# Patient Record
Sex: Male | Born: 1985 | Race: White | Hispanic: No | Marital: Single | Smoking: Former smoker
Health system: Southern US, Community
[De-identification: ages and names within clinical notes are randomized; demographics above are authoritative.]

## PROBLEM LIST (undated history)

## (undated) DIAGNOSIS — H43393 Other vitreous opacities, bilateral: Secondary | ICD-10-CM

## (undated) DIAGNOSIS — F419 Anxiety disorder, unspecified: Secondary | ICD-10-CM

## (undated) DIAGNOSIS — R561 Post traumatic seizures: Secondary | ICD-10-CM

## (undated) DIAGNOSIS — F1010 Alcohol abuse, uncomplicated: Secondary | ICD-10-CM

## (undated) DIAGNOSIS — F4310 Post-traumatic stress disorder, unspecified: Secondary | ICD-10-CM

## (undated) DIAGNOSIS — S062X9A Diffuse traumatic brain injury with loss of consciousness of unspecified duration, initial encounter: Secondary | ICD-10-CM

## (undated) DIAGNOSIS — F32A Depression, unspecified: Secondary | ICD-10-CM

## (undated) DIAGNOSIS — F329 Major depressive disorder, single episode, unspecified: Secondary | ICD-10-CM

## (undated) HISTORY — DX: Depression, unspecified: F32.A

## (undated) HISTORY — DX: Diffuse traumatic brain injury with loss of consciousness of unspecified duration, initial encounter: S06.2X9A

## (undated) HISTORY — DX: Post traumatic seizures: R56.1

## (undated) HISTORY — PX: KNEE SURGERY: SHX244

## (undated) HISTORY — DX: Anxiety disorder, unspecified: F41.9

## (undated) HISTORY — DX: Major depressive disorder, single episode, unspecified: F32.9

## (undated) HISTORY — DX: Other vitreous opacities, bilateral: H43.393

## (undated) HISTORY — DX: Alcohol abuse, uncomplicated: F10.10

## (undated) HISTORY — DX: Post-traumatic stress disorder, unspecified: F43.10

---
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
## 2004-03-28 ENCOUNTER — Ambulatory Visit: Payer: Self-pay | Admitting: Internal Medicine

## 2004-04-07 ENCOUNTER — Emergency Department (HOSPITAL_COMMUNITY): Admission: EM | Admit: 2004-04-07 | Discharge: 2004-04-07 | Payer: Self-pay | Admitting: Emergency Medicine

## 2004-10-09 ENCOUNTER — Emergency Department (HOSPITAL_COMMUNITY): Admission: EM | Admit: 2004-10-09 | Discharge: 2004-10-09 | Payer: Self-pay | Admitting: Emergency Medicine

## 2006-08-08 ENCOUNTER — Ambulatory Visit: Payer: Self-pay | Admitting: Internal Medicine

## 2006-11-14 ENCOUNTER — Ambulatory Visit: Payer: Self-pay | Admitting: Internal Medicine

## 2006-11-14 DIAGNOSIS — F411 Generalized anxiety disorder: Secondary | ICD-10-CM | POA: Insufficient documentation

## 2006-11-14 LAB — CONVERTED CEMR LAB

## 2006-11-19 ENCOUNTER — Telehealth: Payer: Self-pay | Admitting: *Deleted

## 2006-11-21 ENCOUNTER — Telehealth: Payer: Self-pay | Admitting: Internal Medicine

## 2007-01-20 ENCOUNTER — Telehealth (INDEPENDENT_AMBULATORY_CARE_PROVIDER_SITE_OTHER): Payer: Self-pay | Admitting: *Deleted

## 2007-01-29 ENCOUNTER — Telehealth: Payer: Self-pay | Admitting: Internal Medicine

## 2007-05-06 ENCOUNTER — Telehealth: Payer: Self-pay | Admitting: Internal Medicine

## 2007-05-14 ENCOUNTER — Telehealth (INDEPENDENT_AMBULATORY_CARE_PROVIDER_SITE_OTHER): Payer: Self-pay | Admitting: *Deleted

## 2007-05-18 ENCOUNTER — Ambulatory Visit: Payer: Self-pay | Admitting: Family Medicine

## 2007-05-18 DIAGNOSIS — R61 Generalized hyperhidrosis: Secondary | ICD-10-CM | POA: Insufficient documentation

## 2007-05-19 LAB — CONVERTED CEMR LAB
Albumin: 4.5 g/dL (ref 3.5–5.2)
Alkaline Phosphatase: 60 units/L (ref 39–117)
Amylase: 56 units/L (ref 27–131)
Basophils Absolute: 0 10*3/uL (ref 0.0–0.1)
CO2: 30 meq/L (ref 19–32)
Calcium: 9.6 mg/dL (ref 8.4–10.5)
Chloride: 103 meq/L (ref 96–112)
Creatinine, Ser: 1 mg/dL (ref 0.4–1.5)
Eosinophils Relative: 2.8 % (ref 0.0–5.0)
GFR calc non Af Amer: 99 mL/min
Glucose, Bld: 68 mg/dL — ABNORMAL LOW (ref 70–99)
HCT: 48.9 % (ref 39.0–52.0)
MCHC: 35.1 g/dL (ref 30.0–36.0)
Potassium: 4.6 meq/L (ref 3.5–5.1)
TSH: 1.8 microintl units/mL (ref 0.35–5.50)
Total Bilirubin: 0.7 mg/dL (ref 0.3–1.2)

## 2007-05-29 ENCOUNTER — Telehealth: Payer: Self-pay | Admitting: Family Medicine

## 2007-07-24 ENCOUNTER — Telehealth: Payer: Self-pay | Admitting: Family Medicine

## 2007-07-24 DIAGNOSIS — L708 Other acne: Secondary | ICD-10-CM

## 2007-09-30 ENCOUNTER — Telehealth: Payer: Self-pay | Admitting: Family Medicine

## 2007-10-09 ENCOUNTER — Telehealth (INDEPENDENT_AMBULATORY_CARE_PROVIDER_SITE_OTHER): Payer: Self-pay | Admitting: *Deleted

## 2007-11-13 ENCOUNTER — Telehealth: Payer: Self-pay | Admitting: Family Medicine

## 2007-11-13 ENCOUNTER — Emergency Department (HOSPITAL_BASED_OUTPATIENT_CLINIC_OR_DEPARTMENT_OTHER): Admission: EM | Admit: 2007-11-13 | Discharge: 2007-11-13 | Payer: Self-pay | Admitting: Emergency Medicine

## 2007-11-26 ENCOUNTER — Emergency Department (HOSPITAL_BASED_OUTPATIENT_CLINIC_OR_DEPARTMENT_OTHER): Admission: EM | Admit: 2007-11-26 | Discharge: 2007-11-26 | Payer: Self-pay | Admitting: Emergency Medicine

## 2008-02-05 ENCOUNTER — Telehealth: Payer: Self-pay | Admitting: Family Medicine

## 2008-02-17 ENCOUNTER — Telehealth: Payer: Self-pay | Admitting: Family Medicine

## 2008-07-08 ENCOUNTER — Ambulatory Visit: Payer: Self-pay | Admitting: Family Medicine

## 2008-07-08 DIAGNOSIS — M545 Low back pain, unspecified: Secondary | ICD-10-CM | POA: Insufficient documentation

## 2008-07-08 DIAGNOSIS — J209 Acute bronchitis, unspecified: Secondary | ICD-10-CM

## 2008-07-11 LAB — CONVERTED CEMR LAB
ALT: 16 units/L (ref 0–53)
AST: 21 units/L (ref 0–37)
Alkaline Phosphatase: 67 units/L (ref 39–117)
Total Bilirubin: 0.8 mg/dL (ref 0.3–1.2)
Total Protein: 7.9 g/dL (ref 6.0–8.3)

## 2008-08-22 ENCOUNTER — Telehealth: Payer: Self-pay | Admitting: Family Medicine

## 2008-11-03 ENCOUNTER — Ambulatory Visit: Payer: Self-pay | Admitting: Family Medicine

## 2008-11-03 DIAGNOSIS — B351 Tinea unguium: Secondary | ICD-10-CM | POA: Insufficient documentation

## 2008-11-17 ENCOUNTER — Ambulatory Visit: Payer: Self-pay | Admitting: Family Medicine

## 2008-11-17 DIAGNOSIS — S83509A Sprain of unspecified cruciate ligament of unspecified knee, initial encounter: Secondary | ICD-10-CM

## 2008-12-20 ENCOUNTER — Ambulatory Visit: Payer: Self-pay | Admitting: Family Medicine

## 2008-12-20 DIAGNOSIS — R1031 Right lower quadrant pain: Secondary | ICD-10-CM

## 2008-12-20 LAB — CONVERTED CEMR LAB
Bilirubin Urine: NEGATIVE
Nitrite: NEGATIVE
Specific Gravity, Urine: >=1.03
WBC Urine, dipstick: NEGATIVE
pH: 6

## 2008-12-21 ENCOUNTER — Telehealth: Payer: Self-pay | Admitting: Family Medicine

## 2008-12-21 LAB — CONVERTED CEMR LAB
ALT: 20 units/L (ref 0–53)
Albumin: 4.6 g/dL (ref 3.5–5.2)
BUN: 13 mg/dL (ref 6–23)
Eosinophils Absolute: 0.3 10*3/uL (ref 0.0–0.7)
Glucose, Bld: 86 mg/dL (ref 70–99)
Hemoglobin: 16.4 g/dL (ref 13.0–17.0)
Lymphocytes Relative: 28.1 % (ref 12.0–46.0)
MCHC: 34.8 g/dL (ref 30.0–36.0)
MCV: 93.7 fL (ref 78.0–100.0)
Monocytes Absolute: 0.5 10*3/uL (ref 0.1–1.0)
Platelets: 200 10*3/uL (ref 150.0–400.0)
RBC: 5.03 M/uL (ref 4.22–5.81)
RDW: 12.9 % (ref 11.5–14.6)
Sodium: 143 meq/L (ref 135–145)
WBC: 5.9 10*3/uL (ref 4.5–10.5)

## 2008-12-28 ENCOUNTER — Telehealth: Payer: Self-pay | Admitting: Family Medicine

## 2008-12-30 ENCOUNTER — Telehealth: Payer: Self-pay | Admitting: Family Medicine

## 2009-09-11 ENCOUNTER — Telehealth: Payer: Self-pay | Admitting: Family Medicine

## 2009-09-12 ENCOUNTER — Telehealth: Payer: Self-pay | Admitting: Internal Medicine

## 2010-01-27 ENCOUNTER — Emergency Department (HOSPITAL_COMMUNITY)
Admission: EM | Admit: 2010-01-27 | Discharge: 2010-01-27 | Payer: Self-pay | Source: Home / Self Care | Admitting: Emergency Medicine

## 2010-05-29 NOTE — Progress Notes (Signed)
Summary: antibiotics.  Phone Note Call from Patient   Caller: Patient Call For: Nelwyn Salisbury MD Summary of Call: Pt is requesting antibiotics as Dr. Clent Ridges has denied the request until he has OV. Pt wants to ask Dr. Lovell Sheehan???? Explained he has not seen Dr. Leretha Pol in 2-3 years.  He states he has seen him within the year.  Initial call taken by: Lynann Beaver CMA,  Sep 12, 2009 1:31 PM  Follow-up for Phone Call        NO.  per Dr. Lovell Sheehan.  Pt was advised in previous call that Dr. Lovell Sheehan would not do this. Follow-up by: Lynann Beaver CMA,  Sep 12, 2009 1:34 PM     Appended Document: antibiotics. Called again.  Same request.  Patient advised.

## 2010-05-29 NOTE — Progress Notes (Signed)
Summary: wants Rx  Phone Note Call from Patient Call back at [phone]   Caller: Patient Call For: Nelwyn Salisbury MD Summary of Call: C/o congestion, hacking cough. No fever. Took 3 days Keflex for sore throat. Now says he wants Z-pak. [facility]. Initial call taken by: Raechel Ache, RN,  Sep 11, 2009 2:07 PM  Follow-up for Phone Call        needs an OV Follow-up by: Nelwyn Salisbury MD,  Sep 11, 2009 5:18 PM  Additional Follow-up for Phone Call Additional follow up Details #1::        Phone Call Completed Additional Follow-up by: Raechel Ache, RN,  Sep 11, 2009 5:20 PM

## 2010-08-15 ENCOUNTER — Telehealth: Payer: Self-pay | Admitting: *Deleted

## 2010-08-15 ENCOUNTER — Telehealth: Payer: Self-pay | Admitting: Family Medicine

## 2010-08-15 MED ORDER — DOXYCYCLINE HYCLATE 100 MG PO TABS: 100.0000 mg | ORAL_TABLET | Freq: Two times a day (BID) | ORAL | Status: DC

## 2010-08-15 MED ORDER — DOXYCYCLINE HYCLATE 100 MG PO TABS: 100.0000 mg | ORAL_TABLET | Freq: Two times a day (BID) | ORAL | Status: AC

## 2010-08-15 NOTE — Telephone Encounter (Signed)
Ok rx resent [address] .

## 2010-08-15 NOTE — Telephone Encounter (Signed)
Could not notify pt due to wrong numbers provided.

## 2010-08-15 NOTE — Telephone Encounter (Signed)
[facility] called from [place], [place] and said that they rcvd a ecript for this pt. Pls res[facility] to correct [facility] in [place], [place] on [address].

## 2010-08-15 NOTE — Telephone Encounter (Signed)
Pt wants prescription for Doxycycline 100 mg for Malaria prevention.  Traveling outside of the country.

## 2010-08-15 NOTE — Telephone Encounter (Signed)
Call in 100 mg bid , #60 with no rf

## 2010-08-16 ENCOUNTER — Telehealth: Payer: Self-pay | Admitting: Family Medicine

## 2010-08-16 NOTE — Telephone Encounter (Signed)
Pt called and said that he needs the Doxycycline 100 mg [facility] [place] [phone].  Pt is going out of country and needs to start taking, before he leaves.

## 2010-08-17 NOTE — Telephone Encounter (Signed)
Pt notified and apologized that he did not update his file but requesting antibiotic called to [facility] [place]. rx called in and  patient notified......Marland Kitchen

## 2010-10-24 ENCOUNTER — Telehealth: Payer: Self-pay | Admitting: *Deleted
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
## 2010-10-25 NOTE — Telephone Encounter (Signed)
Pt notified of Dr. Claris Che recommendations.

## 2010-10-25 NOTE — Telephone Encounter (Signed)
NO, he needs to get these from a local [place] doctor

## 2010-11-20 ENCOUNTER — Encounter: Payer: Self-pay | Admitting: Family Medicine

## 2010-11-20 ENCOUNTER — Ambulatory Visit (INDEPENDENT_AMBULATORY_CARE_PROVIDER_SITE_OTHER): Payer: 59 | Admitting: Family Medicine

## 2010-11-20 VITALS — BP 116/78 | HR 92 | Temp 98.4°F | Wt 153.0 lb

## 2010-11-20 DIAGNOSIS — F411 Generalized anxiety disorder: Secondary | ICD-10-CM

## 2010-11-20 DIAGNOSIS — R5383 Other fatigue: Secondary | ICD-10-CM

## 2010-11-20 DIAGNOSIS — R5381 Other malaise: Secondary | ICD-10-CM

## 2010-11-20 DIAGNOSIS — L409 Psoriasis, unspecified: Secondary | ICD-10-CM

## 2010-11-20 DIAGNOSIS — F419 Anxiety disorder, unspecified: Secondary | ICD-10-CM

## 2010-11-20 DIAGNOSIS — L408 Other psoriasis: Secondary | ICD-10-CM

## 2010-11-20 MED ORDER — CYANOCOBALAMIN 1000 MCG/ML IJ SOLN
1000.0000 ug | Freq: Once | INTRAMUSCULAR | Status: AC
  Administered 2010-11-20: 1000 ug via INTRAMUSCULAR
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
## 2010-11-21 ENCOUNTER — Telehealth: Payer: Self-pay | Admitting: Family Medicine

## 2010-11-21 LAB — VITAMIN B12: Vitamin B-12: 306 pg/mL (ref 211–911)

## 2010-11-21 LAB — CBC WITH DIFFERENTIAL/PLATELET
Basophils Relative: 0.7 % (ref 0.0–3.0)
Eosinophils Absolute: 0.2 10*3/uL (ref 0.0–0.7)
Eosinophils Relative: 2.6 % (ref 0.0–5.0)
Hemoglobin: 16.8 g/dL (ref 13.0–17.0)
Lymphs Abs: 1.6 10*3/uL (ref 0.7–4.0)
MCHC: 34.2 g/dL (ref 30.0–36.0)
Monocytes Absolute: 0.8 10*3/uL (ref 0.1–1.0)
Monocytes Relative: 11.7 % (ref 3.0–12.0)
Platelets: 194 10*3/uL (ref 150.0–400.0)
RBC: 5.28 Mil/uL (ref 4.22–5.81)

## 2010-11-21 LAB — BASIC METABOLIC PANEL
CO2: 29 mEq/L (ref 19–32)
Chloride: 105 mEq/L (ref 96–112)
Sodium: 142 mEq/L (ref 135–145)

## 2010-11-21 LAB — HEPATIC FUNCTION PANEL
Albumin: 5.2 g/dL (ref 3.5–5.2)
Alkaline Phosphatase: 54 U/L (ref 39–117)
Bilirubin, Direct: 0 mg/dL (ref 0.0–0.3)
Total Bilirubin: 0.6 mg/dL (ref 0.3–1.2)

## 2010-11-21 LAB — TSH: TSH: 1.86 u[IU]/mL (ref 0.35–5.50)

## 2010-11-21 LAB — HIV ANTIBODY (ROUTINE TESTING W REFLEX): HIV: NONREACTIVE

## 2010-11-21 NOTE — Telephone Encounter (Signed)
Pt called to req lab results. Pls cal back asap.

## 2010-11-22 ENCOUNTER — Telehealth: Payer: Self-pay | Admitting: *Deleted

## 2010-11-22 NOTE — Telephone Encounter (Signed)
Pt asking for labs, and we went over his lab values.   He wants to know what Dr. Clent Ridges thinks about his symptoms as he is still having the same problems from his last visit.

## 2010-11-22 NOTE — Telephone Encounter (Signed)
Pt aware.

## 2010-11-22 NOTE — Telephone Encounter (Signed)
My best explanation for his chronic fatigue is anxiety, since we see this all the time. Unfortunately he has not found anything that works well for him. I suggest he see a doctor in [place] about trying something else

## 2011-08-26 ENCOUNTER — Telehealth: Payer: Self-pay | Admitting: *Deleted
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
## 2011-08-27 NOTE — Telephone Encounter (Signed)
agreed

## 2011-11-18 ENCOUNTER — Telehealth: Payer: Self-pay | Admitting: Family Medicine

## 2011-11-18 NOTE — Telephone Encounter (Signed)
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  Wants to know if Dr. Clent Barrett would consider Rx for this.   Declines triage; would like to discuss with Dr. Clent Barrett and/or try some samples for this.  Info to office for provider review/callback. MAY REACH PATIENT [phone].

## 2011-11-19 NOTE — Telephone Encounter (Signed)
Pt decline ov.  °

## 2011-11-19 NOTE — Telephone Encounter (Signed)
Left voice message with below information. 

## 2011-11-19 NOTE — Telephone Encounter (Signed)
He needs an OV to discuss this  

## 2011-12-12 ENCOUNTER — Telehealth: Payer: Self-pay | Admitting: Family Medicine

## 2011-12-12 NOTE — Telephone Encounter (Signed)
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 Left knee and foot has screws and plates and balance issues. Please advise. Patient states that he does not have health insurance and can not afford to come in as his VA disability has not been sent to [place] from [place] and he has to re-register and this can take 90days.

## 2011-12-13 NOTE — Telephone Encounter (Signed)
Please advise 

## 2011-12-13 NOTE — Telephone Encounter (Signed)
Attempt to call pt - James Barrett - James Barrett if problems- gave dr. Claris Che instructions.

## 2011-12-13 NOTE — Telephone Encounter (Signed)
NO I cannot do this. He needs to use the [place] system or the orthopedist who did his surgery

## 2012-02-01 ENCOUNTER — Ambulatory Visit

## 2012-02-10 ENCOUNTER — Telehealth: Payer: Self-pay

## 2012-02-10 NOTE — Telephone Encounter (Signed)
Patient just called back. He made a mistake and was seen [facility] not here. Please disregard all messages.

## 2012-02-10 NOTE — Telephone Encounter (Signed)
There is no documentation you had office visit with this patient, there are no charges or office notes. Please advise, patient states he had visit with you, perhaps you can go back and document this under the 02/01/12 encounter? Please advise do you want to renew his prednisone and norco I am unsure what you gave and how it was dosed. Amy

## 2012-02-10 NOTE — Telephone Encounter (Signed)
The patient called to ask Dr. Milus Glazier to refill his Norco and Prednisone rxs, that were written 02/01/12, but there is no record of this appointment being completed.  In the system is shows as "no show" EOD status.  The patient stated he was seen for poison ivy and the prednisone has helped "a lot", but his VA appointment is not until 02/21/12. Please call the patient at [phone].

## 2012-02-10 NOTE — Telephone Encounter (Signed)
FYI, you did not forget this, there was not a visit, he was seen elsewhere. Amy

## 2012-04-27 ENCOUNTER — Telehealth: Payer: Self-pay | Admitting: Family Medicine

## 2012-04-27 NOTE — Telephone Encounter (Signed)
[facility] Triage Call Report Triage Record Num: [id number] Operator: Albertine Grates Patient Name: James Barrett Call Date & Time: 04/24/2012 5:18:25PM Patient Phone: [phone] PCP: Tera Mater. Clent Ridges Patient Gender: Male PCP Fax : [phone] Patient DOB: [birth year] [facility] Name: [facility] Reason for Call: Caller: James Barrett/Patient; PCP: Gershon Crane (Family Practice); CB#: [phone]; Has vomiting/diarrhea since 12-26. Has vomited x3 and had diarrhea x 10. Afebrile. Is voiding well. Declined Phenergan for nausea. Home care advice givne per Vomiting protocol. Protocol(s) Used: Nausea or Vomiting Recommended Outcome per Protocol: Provide Home/Self Care Reason for Outcome: New onset of 3-4 episodes vomiting or diarrhea following mild abdominal cramping Care Advice: ~ Call provider if symptoms do not improve after 24 hours of home care. Go to the ED if you have developed signs and symptoms of dehydration such as very dry mouth and tongue; increased pulse rate at rest; no urine output for 8 hours or more; increasing weakness or drowsiness, or lightheadedness when trying to sit upright or standing. ~ Vomiting and Diarrhea Care: - Do not eat solid foods until vomiting subsides. - Begin taking fluids by sucking on ice chips or popsicles or taking sips of cool, clear fluids (soda, fruit juices that are low acid, sports drinks or nonprescription oral rehydration solution). - Gradually drink larger amounts of these fluids so that you are drinking six to eight 8 oz. (1.2 to 1.6 liters) of fluids a day. - Keep activity to a minimum. - Once vomiting and diarrhea subside, eat smaller, more frequent meals of easily digested foods such as crackers, toast, bananas, rice, cooked cereal, applesauce, broth, baked or mashed potatoes, chicken or [place] without skin. Eat slowly. - Take fluids 30 minutes before or 60 minutes after meals. - Avoid high fat, highly seasoned, high fiber or high  sugar content foods. - Avoid extremely hot or cold foods. - Do not take pain medication (such as aspirin, NSAIDs) while nauseated or vomiting. - Do not drink caffeinated or alcoholic beverages. ~ 04/24/2012 5:25:40PM Page 1 of 1 [id number]

## 2012-06-07 ENCOUNTER — Encounter (HOSPITAL_COMMUNITY): Payer: Self-pay | Admitting: Emergency Medicine

## 2012-06-07 ENCOUNTER — Emergency Department (HOSPITAL_COMMUNITY): Payer: Non-veteran care

## 2012-06-07 ENCOUNTER — Emergency Department (HOSPITAL_COMMUNITY)
Admission: EM | Admit: 2012-06-07 | Discharge: 2012-06-07 | Disposition: A | Discharge status: Home / Self Care | Payer: Non-veteran care | Attending: Emergency Medicine | Admitting: Emergency Medicine

## 2012-06-07 DIAGNOSIS — F17200 Nicotine dependence, unspecified, uncomplicated: Secondary | ICD-10-CM | POA: Insufficient documentation

## 2012-06-07 DIAGNOSIS — M25569 Pain in unspecified knee: Secondary | ICD-10-CM | POA: Insufficient documentation

## 2012-06-07 DIAGNOSIS — F10929 Alcohol use, unspecified with intoxication, unspecified: Secondary | ICD-10-CM

## 2012-06-07 DIAGNOSIS — Z9889 Other specified postprocedural states: Secondary | ICD-10-CM | POA: Insufficient documentation

## 2012-06-07 DIAGNOSIS — F1010 Alcohol abuse, uncomplicated: Secondary | ICD-10-CM | POA: Insufficient documentation

## 2012-06-07 DIAGNOSIS — G8918 Other acute postprocedural pain: Secondary | ICD-10-CM | POA: Insufficient documentation

## 2012-06-07 DIAGNOSIS — M25561 Pain in right knee: Secondary | ICD-10-CM

## 2012-06-07 DIAGNOSIS — S59909A Unspecified injury of unspecified elbow, initial encounter: Secondary | ICD-10-CM | POA: Insufficient documentation

## 2012-06-07 DIAGNOSIS — S6990XA Unspecified injury of unspecified wrist, hand and finger(s), initial encounter: Secondary | ICD-10-CM | POA: Insufficient documentation

## 2012-06-07 DIAGNOSIS — Z8659 Personal history of other mental and behavioral disorders: Secondary | ICD-10-CM | POA: Insufficient documentation

## 2012-06-07 DIAGNOSIS — M25531 Pain in right wrist: Secondary | ICD-10-CM

## 2012-06-07 MED ORDER — NICOTINE 21 MG/24HR TD PT24
21.0000 mg | MEDICATED_PATCH | Freq: Once | TRANSDERMAL | Status: DC
  Filled 2012-06-07: qty 1
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
## 2012-06-07 NOTE — ED Notes (Signed)
Pts mother left phone number, took brother home. Mom will be back later. [phone]

## 2012-06-07 NOTE — ED Notes (Signed)
Refused DC VS
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
## 2012-06-07 NOTE — ED Notes (Signed)
Refusing nicotine patch. Pt stating that he is going to go outside and smoke a cigarette. Explained that he cannot leave. Pt agreed to stay for now.
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
## 2012-09-04 ENCOUNTER — Encounter (HOSPITAL_COMMUNITY): Payer: Self-pay | Admitting: *Deleted

## 2012-09-04 ENCOUNTER — Emergency Department (HOSPITAL_COMMUNITY)
Admission: EM | Admit: 2012-09-04 | Discharge: 2012-09-04 | Discharge status: Against Medical Advice | Payer: Non-veteran care | Attending: Emergency Medicine | Admitting: Emergency Medicine

## 2012-09-04 DIAGNOSIS — S0993XA Unspecified injury of face, initial encounter: Secondary | ICD-10-CM | POA: Insufficient documentation

## 2012-09-04 DIAGNOSIS — F17200 Nicotine dependence, unspecified, uncomplicated: Secondary | ICD-10-CM | POA: Insufficient documentation

## 2012-09-04 DIAGNOSIS — F1010 Alcohol abuse, uncomplicated: Secondary | ICD-10-CM | POA: Insufficient documentation

## 2012-09-04 DIAGNOSIS — S0510XA Contusion of eyeball and orbital tissues, unspecified eye, initial encounter: Secondary | ICD-10-CM | POA: Insufficient documentation

## 2012-09-04 NOTE — ED Notes (Signed)
Pt goes back and forth between wanting to be seen and leaving AMA; pt is alert, oriented; [employer] with pt and advised that he may leave or he may stay to be seen; pt walking around in triage, continues to decline to allow RN to assess pt.
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
## 2012-09-04 NOTE — ED Notes (Signed)
[employer] officer advises pt that he can give him a ride home if pt wants to go; pt walked out of ER with [employer] officer

## 2012-09-05 ENCOUNTER — Encounter (HOSPITAL_COMMUNITY): Payer: Self-pay

## 2012-09-05 ENCOUNTER — Emergency Department (HOSPITAL_COMMUNITY): Payer: Non-veteran care

## 2012-09-05 ENCOUNTER — Inpatient Hospital Stay (HOSPITAL_COMMUNITY)
Admission: EM | Admit: 2012-09-05 | Discharge: 2012-09-06 | DRG: 086 | Discharge status: Against Medical Advice | Payer: Non-veteran care | Attending: Neurosurgery | Admitting: Neurosurgery

## 2012-09-05 DIAGNOSIS — R569 Unspecified convulsions: Secondary | ICD-10-CM

## 2012-09-05 DIAGNOSIS — S06330A Contusion and laceration of cerebrum, unspecified, without loss of consciousness, initial encounter: Principal | ICD-10-CM | POA: Diagnosis present

## 2012-09-05 DIAGNOSIS — R561 Post traumatic seizures: Secondary | ICD-10-CM

## 2012-09-05 DIAGNOSIS — F17200 Nicotine dependence, unspecified, uncomplicated: Secondary | ICD-10-CM | POA: Diagnosis present

## 2012-09-05 DIAGNOSIS — S062XAA Diffuse traumatic brain injury with loss of consciousness status unknown, initial encounter: Secondary | ICD-10-CM

## 2012-09-05 DIAGNOSIS — S06320A Contusion and laceration of left cerebrum without loss of consciousness, initial encounter: Secondary | ICD-10-CM

## 2012-09-05 DIAGNOSIS — F411 Generalized anxiety disorder: Secondary | ICD-10-CM | POA: Diagnosis present

## 2012-09-05 DIAGNOSIS — S062X9A Diffuse traumatic brain injury with loss of consciousness of unspecified duration, initial encounter: Secondary | ICD-10-CM

## 2012-09-05 DIAGNOSIS — F4310 Post-traumatic stress disorder, unspecified: Secondary | ICD-10-CM | POA: Diagnosis present

## 2012-09-05 HISTORY — DX: Diffuse traumatic brain injury with loss of consciousness status unknown, initial encounter: S06.2XAA

## 2012-09-05 HISTORY — DX: Post traumatic seizures: R56.1

## 2012-09-05 HISTORY — DX: Diffuse traumatic brain injury with loss of consciousness of unspecified duration, initial encounter: S06.2X9A

## 2012-09-05 LAB — RAPID URINE DRUG SCREEN, HOSP PERFORMED
Barbiturates: NOT DETECTED
Benzodiazepines: NOT DETECTED

## 2012-09-05 LAB — COMPREHENSIVE METABOLIC PANEL
AST: 40 U/L — ABNORMAL HIGH (ref 0–37)
GFR calc Af Amer: >90 mL/min (ref >90)
GFR calc non Af Amer: 83 mL/min — ABNORMAL LOW (ref >90)
Glucose, Bld: 119 mg/dL — ABNORMAL HIGH (ref 70–99)
Total Bilirubin: 0.4 mg/dL (ref 0.3–1.2)
Total Protein: 7.6 g/dL (ref 6.0–8.3)

## 2012-09-05 LAB — PROTIME-INR
INR: 1.06 (ref 0.00–1.49)
Prothrombin Time: 13.7 seconds (ref 11.6–15.2)

## 2012-09-05 LAB — URINE MICROSCOPIC-ADD ON

## 2012-09-05 LAB — CBC WITH DIFFERENTIAL/PLATELET
Eosinophils Absolute: 0 10*3/uL (ref 0.0–0.7)
Hemoglobin: 15.4 g/dL (ref 13.0–17.0)
Lymphs Abs: 2 10*3/uL (ref 0.7–4.0)
MCH: 32.2 pg (ref 26.0–34.0)
Monocytes Relative: 2 % — ABNORMAL LOW (ref 3–12)
Neutrophils Relative %: 87 % — ABNORMAL HIGH (ref 43–77)
RBC: 4.79 MIL/uL (ref 4.22–5.81)

## 2012-09-05 LAB — URINALYSIS, ROUTINE W REFLEX MICROSCOPIC
Leukocytes, UA: NEGATIVE
Nitrite: NEGATIVE
Protein, ur: 30 mg/dL — AB
Urobilinogen, UA: 0.2 mg/dL (ref 0.0–1.0)

## 2012-09-05 LAB — TRICYCLICS SCREEN, URINE: TCA Scrn: NOT DETECTED

## 2012-09-05 LAB — GLUCOSE, CAPILLARY: Glucose-Capillary: 118 mg/dL — ABNORMAL HIGH (ref 70–99)

## 2012-09-05 MED ORDER — HYDROCODONE-ACETAMINOPHEN 5-325 MG PO TABS
1.0000 | ORAL_TABLET | ORAL | Status: DC | PRN
  Administered 2012-09-06 (×3): 1 via ORAL
  Filled 2012-09-05 (×3): qty 1

## 2012-09-05 MED ORDER — LORAZEPAM 2 MG/ML IJ SOLN
INTRAMUSCULAR | Status: AC
  Administered 2012-09-05: 1 mg via INTRAVENOUS
  Filled 2012-09-05: qty 3

## 2012-09-05 MED ORDER — ONDANSETRON HCL 4 MG PO TABS: 4.0000 mg | ORAL_TABLET | Freq: Four times a day (QID) | ORAL | Status: DC | PRN

## 2012-09-05 MED ORDER — ACETAMINOPHEN 325 MG PO TABS
650.0000 mg | ORAL_TABLET | ORAL | Status: DC | PRN
  Administered 2012-09-06: 650 mg via ORAL
  Filled 2012-09-05: qty 2

## 2012-09-05 MED ORDER — LORAZEPAM 2 MG/ML IJ SOLN
1.0000 mg | Freq: Once | INTRAMUSCULAR | Status: AC
  Administered 2012-09-05: 1 mg via INTRAVENOUS

## 2012-09-05 MED ORDER — SODIUM CHLORIDE 0.9 % IV SOLN
INTRAVENOUS | Status: AC
  Administered 2012-09-06: via INTRAVENOUS

## 2012-09-05 MED ORDER — SODIUM CHLORIDE 0.9 % IV SOLN
500.0000 mg | Freq: Two times a day (BID) | INTRAVENOUS | Status: DC
  Filled 2012-09-05 (×2): qty 5

## 2012-09-05 MED ORDER — SODIUM CHLORIDE 0.9 % IV SOLN
500.0000 mg | Freq: Two times a day (BID) | INTRAVENOUS | Status: DC
  Administered 2012-09-06: 500 mg via INTRAVENOUS
  Filled 2012-09-05 (×2): qty 5

## 2012-09-05 MED ORDER — SODIUM CHLORIDE 0.9 % IV SOLN
INTRAVENOUS | Status: DC
  Filled 2012-09-05: qty 1000

## 2012-09-05 MED ORDER — ONDANSETRON HCL 4 MG/2ML IJ SOLN: 4.0000 mg | Freq: Four times a day (QID) | INTRAMUSCULAR | Status: DC | PRN

## 2012-09-05 MED ORDER — SODIUM CHLORIDE 0.9 % IV SOLN
500.0000 mg | Freq: Once | INTRAVENOUS | Status: AC
  Administered 2012-09-05: 500 mg via INTRAVENOUS
  Filled 2012-09-05: qty 5

## 2012-09-05 MED ORDER — ONDANSETRON HCL 4 MG/2ML IJ SOLN
4.0000 mg | Freq: Once | INTRAMUSCULAR | Status: AC
  Administered 2012-09-05: 4 mg via INTRAVENOUS
  Filled 2012-09-05: qty 2

## 2012-09-05 MED ORDER — MORPHINE SULFATE 4 MG/ML IJ SOLN
4.0000 mg | Freq: Once | INTRAMUSCULAR | Status: AC
  Administered 2012-09-05: 4 mg via INTRAVENOUS
  Filled 2012-09-05: qty 1

## 2012-09-05 MED ORDER — SODIUM CHLORIDE 0.9 % IV SOLN
Freq: Once | INTRAVENOUS | Status: AC
  Administered 2012-09-05: 18:00:00 via INTRAVENOUS

## 2012-09-05 MED ORDER — LORAZEPAM 2 MG/ML IJ SOLN: 1.0000 mg | Freq: Once | INTRAMUSCULAR | Status: AC | PRN
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
## 2012-09-06 ENCOUNTER — Emergency Department (HOSPITAL_COMMUNITY)
Admission: EM | Admit: 2012-09-06 | Discharge: 2012-09-06 | Disposition: A | Discharge status: Home / Self Care | Payer: Non-veteran care | Attending: Emergency Medicine | Admitting: Emergency Medicine

## 2012-09-06 ENCOUNTER — Encounter (HOSPITAL_COMMUNITY): Payer: Self-pay | Admitting: Family Medicine

## 2012-09-06 DIAGNOSIS — S060X1A Concussion with loss of consciousness of 30 minutes or less, initial encounter: Secondary | ICD-10-CM | POA: Insufficient documentation

## 2012-09-06 DIAGNOSIS — Z880 Allergy status to penicillin: Secondary | ICD-10-CM | POA: Insufficient documentation

## 2012-09-06 DIAGNOSIS — R569 Unspecified convulsions: Secondary | ICD-10-CM

## 2012-09-06 DIAGNOSIS — S060X1D Concussion with loss of consciousness of 30 minutes or less, subsequent encounter: Secondary | ICD-10-CM

## 2012-09-06 DIAGNOSIS — I619 Nontraumatic intracerebral hemorrhage, unspecified: Secondary | ICD-10-CM | POA: Insufficient documentation

## 2012-09-06 DIAGNOSIS — F17200 Nicotine dependence, unspecified, uncomplicated: Secondary | ICD-10-CM | POA: Insufficient documentation

## 2012-09-06 DIAGNOSIS — Z8659 Personal history of other mental and behavioral disorders: Secondary | ICD-10-CM | POA: Insufficient documentation

## 2012-09-06 LAB — MRSA PCR SCREENING: MRSA by PCR: NEGATIVE

## 2012-09-06 MED ORDER — LORAZEPAM 1 MG PO TABS: 1.0000 mg | ORAL_TABLET | Freq: Four times a day (QID) | ORAL | Status: DC | PRN

## 2012-09-06 MED ORDER — LORAZEPAM 2 MG/ML IJ SOLN
1.0000 mg | Freq: Four times a day (QID) | INTRAMUSCULAR | Status: DC | PRN
  Administered 2012-09-06: 1 mg via INTRAVENOUS
  Filled 2012-09-06: qty 1

## 2012-09-06 MED ORDER — HYDROCODONE-ACETAMINOPHEN 5-325 MG PO TABS: 1.0000 | ORAL_TABLET | Freq: Four times a day (QID) | ORAL | Status: DC | PRN

## 2012-09-06 MED ORDER — NICOTINE 7 MG/24HR TD PT24
7.0000 mg | MEDICATED_PATCH | Freq: Every day | TRANSDERMAL | Status: DC
  Filled 2012-09-06: qty 1

## 2012-09-06 MED ORDER — THIAMINE HCL 100 MG/ML IJ SOLN
100.0000 mg | Freq: Every day | INTRAMUSCULAR | Status: DC
  Filled 2012-09-06: qty 1

## 2012-09-06 MED ORDER — VITAMIN B-1 100 MG PO TABS
100.0000 mg | ORAL_TABLET | Freq: Every day | ORAL | Status: DC
  Filled 2012-09-06: qty 1

## 2012-09-06 MED ORDER — FOLIC ACID 1 MG PO TABS
1.0000 mg | ORAL_TABLET | Freq: Every day | ORAL | Status: DC
  Filled 2012-09-06: qty 1

## 2012-09-06 MED ORDER — ADULT MULTIVITAMIN W/MINERALS CH
1.0000 | ORAL_TABLET | Freq: Every day | ORAL | Status: DC
  Filled 2012-09-06: qty 1

## 2012-09-06 MED ORDER — PANTOPRAZOLE SODIUM 40 MG IV SOLR
40.0000 mg | INTRAVENOUS | Status: DC
  Administered 2012-09-06: 40 mg via INTRAVENOUS
  Filled 2012-09-06 (×2): qty 40

## 2012-09-06 NOTE — ED Notes (Signed)
Dr. Denton Lank at the bedside, pt to be discharged home and to follow up for repeat CT scan.
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
## 2012-09-06 NOTE — ED Notes (Addendum)
Pt continues to wander around in hallways of ED. Pt escorted back to exam room, mother at bedside.

## 2012-09-06 NOTE — Progress Notes (Signed)
eLink Physician-Brief Progress Note Patient Name: James Barrett DOB: 11-May-1985 MRN: [id number]  Date of Service  09/06/2012   HPI/Events of Note  Best Practice   eICU Interventions  Protonix 40 mg IV daily for stress ulcer propy   Intervention Category Intermediate Interventions: Best-practice therapies (e.g. DVT, beta blocker, etc.)  DETERDING,ELIZABETH 09/06/2012, 12:32 AM

## 2012-09-07 ENCOUNTER — Ambulatory Visit (HOSPITAL_COMMUNITY)
Admission: RE | Admit: 2012-09-07 | Discharge: 2012-09-07 | Disposition: A | Discharge status: Home / Self Care | Payer: Non-veteran care | Source: Ambulatory Visit | Attending: Emergency Medicine | Admitting: Emergency Medicine

## 2012-09-07 ENCOUNTER — Emergency Department (HOSPITAL_COMMUNITY)
Admission: EM | Admit: 2012-09-07 | Discharge: 2012-09-07 | Disposition: A | Discharge status: Home / Self Care | Payer: Non-veteran care | Attending: Emergency Medicine | Admitting: Emergency Medicine

## 2012-09-07 ENCOUNTER — Encounter (HOSPITAL_COMMUNITY): Payer: Self-pay | Admitting: Emergency Medicine

## 2012-09-07 DIAGNOSIS — S2020XD Contusion of thorax, unspecified, subsequent encounter: Secondary | ICD-10-CM

## 2012-09-07 DIAGNOSIS — S0003XA Contusion of scalp, initial encounter: Secondary | ICD-10-CM | POA: Insufficient documentation

## 2012-09-07 DIAGNOSIS — S06309S Unspecified focal traumatic brain injury with loss of consciousness of unspecified duration, sequela: Secondary | ICD-10-CM

## 2012-09-07 DIAGNOSIS — Z79899 Other long term (current) drug therapy: Secondary | ICD-10-CM | POA: Insufficient documentation

## 2012-09-07 DIAGNOSIS — X58XXXA Exposure to other specified factors, initial encounter: Secondary | ICD-10-CM | POA: Insufficient documentation

## 2012-09-07 DIAGNOSIS — S20219A Contusion of unspecified front wall of thorax, initial encounter: Secondary | ICD-10-CM | POA: Insufficient documentation

## 2012-09-07 DIAGNOSIS — R51 Headache: Secondary | ICD-10-CM | POA: Insufficient documentation

## 2012-09-07 DIAGNOSIS — S0083XA Contusion of other part of head, initial encounter: Secondary | ICD-10-CM | POA: Insufficient documentation

## 2012-09-07 DIAGNOSIS — S06309A Unspecified focal traumatic brain injury with loss of consciousness of unspecified duration, initial encounter: Secondary | ICD-10-CM | POA: Insufficient documentation

## 2012-09-07 DIAGNOSIS — Z8659 Personal history of other mental and behavioral disorders: Secondary | ICD-10-CM | POA: Insufficient documentation

## 2012-09-07 DIAGNOSIS — F17200 Nicotine dependence, unspecified, uncomplicated: Secondary | ICD-10-CM | POA: Insufficient documentation

## 2012-09-07 DIAGNOSIS — R569 Unspecified convulsions: Secondary | ICD-10-CM | POA: Insufficient documentation

## 2012-09-07 DIAGNOSIS — Z880 Allergy status to penicillin: Secondary | ICD-10-CM | POA: Insufficient documentation

## 2012-09-07 DIAGNOSIS — G936 Cerebral edema: Secondary | ICD-10-CM | POA: Insufficient documentation

## 2012-09-07 DIAGNOSIS — R4182 Altered mental status, unspecified: Secondary | ICD-10-CM | POA: Insufficient documentation

## 2012-09-07 MED ORDER — OXYCODONE-ACETAMINOPHEN 5-325 MG PO TABS: 1.0000 | ORAL_TABLET | ORAL | Status: DC | PRN
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
## 2012-09-11 ENCOUNTER — Ambulatory Visit (INDEPENDENT_AMBULATORY_CARE_PROVIDER_SITE_OTHER): Admitting: Family Medicine

## 2012-09-11 ENCOUNTER — Ambulatory Visit: Admitting: Internal Medicine

## 2012-09-11 ENCOUNTER — Encounter: Payer: Self-pay | Admitting: Family Medicine

## 2012-09-11 VITALS — BP 126/82 | HR 82 | Temp 98.5°F | Wt 148.0 lb

## 2012-09-11 DIAGNOSIS — F411 Generalized anxiety disorder: Secondary | ICD-10-CM

## 2012-09-11 DIAGNOSIS — R079 Chest pain, unspecified: Secondary | ICD-10-CM

## 2012-09-11 DIAGNOSIS — R0781 Pleurodynia: Secondary | ICD-10-CM

## 2012-09-11 DIAGNOSIS — M545 Low back pain, unspecified: Secondary | ICD-10-CM

## 2012-09-11 DIAGNOSIS — R0789 Other chest pain: Secondary | ICD-10-CM

## 2012-09-11 DIAGNOSIS — IMO0001 Reserved for inherently not codable concepts without codable children: Secondary | ICD-10-CM

## 2012-09-11 DIAGNOSIS — S06320D Contusion and laceration of left cerebrum without loss of consciousness, subsequent encounter: Secondary | ICD-10-CM

## 2012-09-11 DIAGNOSIS — Z5189 Encounter for other specified aftercare: Secondary | ICD-10-CM

## 2012-09-11 DIAGNOSIS — M542 Cervicalgia: Secondary | ICD-10-CM

## 2012-09-11 DIAGNOSIS — M546 Pain in thoracic spine: Secondary | ICD-10-CM

## 2012-09-11 MED ORDER — SERTRALINE HCL 50 MG PO TABS: 50.0000 mg | ORAL_TABLET | Freq: Every day | ORAL | Status: DC

## 2012-09-11 MED ORDER — LORAZEPAM 1 MG PO TABS: 1.0000 mg | ORAL_TABLET | Freq: Four times a day (QID) | ORAL | Status: DC | PRN

## 2012-09-11 MED ORDER — HYDROCODONE-ACETAMINOPHEN 10-325 MG PO TABS: 1.0000 | ORAL_TABLET | Freq: Four times a day (QID) | ORAL | Status: DC | PRN
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
## 2012-09-14 ENCOUNTER — Ambulatory Visit: Admitting: Family Medicine

## 2012-09-15 ENCOUNTER — Ambulatory Visit (INDEPENDENT_AMBULATORY_CARE_PROVIDER_SITE_OTHER)
Admission: RE | Admit: 2012-09-15 | Discharge: 2012-09-15 | Disposition: A | Discharge status: Home / Self Care | Source: Ambulatory Visit | Attending: Family Medicine | Admitting: Family Medicine

## 2012-09-15 ENCOUNTER — Telehealth: Payer: Self-pay | Admitting: Family Medicine

## 2012-09-15 ENCOUNTER — Other Ambulatory Visit: Payer: Self-pay | Admitting: Family Medicine

## 2012-09-15 DIAGNOSIS — R0781 Pleurodynia: Secondary | ICD-10-CM

## 2012-09-15 DIAGNOSIS — M545 Low back pain, unspecified: Secondary | ICD-10-CM

## 2012-09-15 DIAGNOSIS — M542 Cervicalgia: Secondary | ICD-10-CM

## 2012-09-15 DIAGNOSIS — M546 Pain in thoracic spine: Secondary | ICD-10-CM

## 2012-09-15 DIAGNOSIS — R079 Chest pain, unspecified: Secondary | ICD-10-CM

## 2012-09-15 NOTE — Progress Notes (Signed)
Quick Note:  I spoke with pt ______ 

## 2012-09-15 NOTE — Telephone Encounter (Signed)
See Xray results.

## 2012-09-15 NOTE — Telephone Encounter (Signed)
PT called to make you aware that all of his xray's where completed and ready for viewing. Please advise.

## 2012-09-15 NOTE — Telephone Encounter (Signed)
I spoke with pt  

## 2012-09-16 ENCOUNTER — Telehealth: Payer: Self-pay | Admitting: Family Medicine

## 2012-09-16 ENCOUNTER — Ambulatory Visit (INDEPENDENT_AMBULATORY_CARE_PROVIDER_SITE_OTHER)
Admission: RE | Admit: 2012-09-16 | Discharge: 2012-09-16 | Disposition: A | Discharge status: Home / Self Care | Source: Ambulatory Visit | Attending: Family Medicine | Admitting: Family Medicine

## 2012-09-16 DIAGNOSIS — S06320D Contusion and laceration of left cerebrum without loss of consciousness, subsequent encounter: Secondary | ICD-10-CM

## 2012-09-16 DIAGNOSIS — Z5189 Encounter for other specified aftercare: Secondary | ICD-10-CM

## 2012-09-16 NOTE — Telephone Encounter (Signed)
Pt did have scan done today at [facility].

## 2012-09-16 NOTE — Progress Notes (Signed)
Quick Note:  I spoke with pt ______ 

## 2012-09-16 NOTE — Telephone Encounter (Signed)
Pt needs ct scan result. Pt had scan today

## 2012-09-16 NOTE — Telephone Encounter (Signed)
We discussed these results with him today

## 2012-09-18 ENCOUNTER — Telehealth: Payer: Self-pay | Admitting: Family Medicine

## 2012-09-18 NOTE — Telephone Encounter (Signed)
This appt is one month out, and that is about what I expected. I think it would be fine to wait until then because his CT scan showed it to be stable

## 2012-09-18 NOTE — Telephone Encounter (Signed)
I spoke with pt's mom and went over the below information.

## 2012-09-18 NOTE — Telephone Encounter (Signed)
Mom states pt cannot get appt with neuro until 6/23. Pt was hoping to get o before then. If they can't get in any sooner, another neurologist would be ok. Pt also having trouble getting visits authorized by the neuro office- they do not want to accept the [place] authorization, they want pt to be self pay. Pls advise.

## 2012-09-30 ENCOUNTER — Telehealth: Payer: Self-pay | Admitting: Family Medicine

## 2012-09-30 ENCOUNTER — Encounter: Payer: Self-pay | Admitting: Family Medicine

## 2012-09-30 ENCOUNTER — Ambulatory Visit (INDEPENDENT_AMBULATORY_CARE_PROVIDER_SITE_OTHER): Payer: Non-veteran care | Admitting: Family Medicine

## 2012-09-30 ENCOUNTER — Ambulatory Visit: Payer: Self-pay | Admitting: Family Medicine

## 2012-09-30 VITALS — BP 124/82 | HR 94 | Temp 98.4°F | Wt 145.0 lb

## 2012-09-30 DIAGNOSIS — S060X9D Concussion with loss of consciousness of unspecified duration, subsequent encounter: Secondary | ICD-10-CM

## 2012-09-30 DIAGNOSIS — M546 Pain in thoracic spine: Secondary | ICD-10-CM

## 2012-09-30 DIAGNOSIS — F411 Generalized anxiety disorder: Secondary | ICD-10-CM

## 2012-09-30 DIAGNOSIS — Z5189 Encounter for other specified aftercare: Secondary | ICD-10-CM

## 2012-09-30 DIAGNOSIS — F1010 Alcohol abuse, uncomplicated: Secondary | ICD-10-CM

## 2012-09-30 MED ORDER — LORAZEPAM 1 MG PO TABS
1.0000 mg | ORAL_TABLET | Freq: Four times a day (QID) | ORAL | Status: DC | PRN
Start: 2012-09-30 — End: 2013-02-20

## 2012-09-30 MED ORDER — PREDNISONE 10 MG PO TABS: ORAL_TABLET | ORAL | Status: DC
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
## 2012-09-30 NOTE — Progress Notes (Signed)
  Subjective:    Patient ID: James Barrett, male    DOB: 02-09-1986, 27 y.o.   MRN: [id number]  HPI Here with his mother to follow up a concussion, seizures, back pain, anxiety and depression, and alcohol abuse. He feels a little better than last time from a physical standpoint but he still has a lot of back pain. This is centered in the middle back between the shoulder blades and it radiates around to the front of the chest. No SOB. He had plain films of the spine and ribs recently that showed no fractures. He has had no more seizures and neurologically he seems to be stable. He is set to see Dr. Smiley Houseman on 10-19-12. He took Zoloft for 3 days and then stopped it because it made him feel very depressed and irritable. The Ativan helps a lot and he is taking this 4 times a day. He still feels depressed but not quite as anxious. He admits to drinking beer and wine over the past week. He has been thinking about a [facility] at the [facility] for 4-6 weeks to treat PTSD and substance abuse. He spoke to them yesterday to get information. His mother agrees with everything he says today.    Review of Systems  Constitutional: Negative.   Respiratory: Negative.   Cardiovascular: Positive for chest pain. Negative for palpitations and leg swelling.  Gastrointestinal: Negative.   Musculoskeletal: Positive for back pain.  Neurological: Negative.   Psychiatric/Behavioral: Positive for dysphoric mood, decreased concentration and agitation. Negative for hallucinations and confusion. The patient is nervous/anxious.        Objective:   Physical Exam  Constitutional: He is oriented to person, place, and time. He appears well-developed and well-nourished. No distress.  Eyes: Conjunctivae and EOM are normal. Pupils are equal, round, and reactive to light.  Cardiovascular: Normal rate, regular rhythm, normal heart sounds and intact distal pulses.   Pulmonary/Chest: Effort normal and breath  sounds normal.  Musculoskeletal:  Tender over the thoracic spine with full ROM  Neurological: He is alert and oriented to person, place, and time. He has normal reflexes. No cranial nerve deficit. He exhibits normal muscle tone. Coordination normal.  Psychiatric: His behavior is normal. Thought content normal.  Affect is mildly depressed          Assessment & Plan:  As for the back pain, we will start him on a Prednisone taper to begin at 40 mg a day. Set up a thoracic spine MRI soon. He will see Neurology as above. Refilled Ativan. He is off the Vicodin now. I urged him to avoid any use of alcohol and to pursue the inpatient program he has been looking at.

## 2012-10-13 ENCOUNTER — Encounter: Payer: Self-pay | Admitting: Family Medicine

## 2012-10-13 ENCOUNTER — Ambulatory Visit (INDEPENDENT_AMBULATORY_CARE_PROVIDER_SITE_OTHER): Payer: Non-veteran care | Admitting: Family Medicine

## 2012-10-13 VITALS — BP 122/70 | HR 97 | Temp 98.3°F | Wt 145.0 lb

## 2012-10-13 DIAGNOSIS — S20211A Contusion of right front wall of thorax, initial encounter: Secondary | ICD-10-CM

## 2012-10-13 DIAGNOSIS — F1010 Alcohol abuse, uncomplicated: Secondary | ICD-10-CM | POA: Insufficient documentation

## 2012-10-13 DIAGNOSIS — F4310 Post-traumatic stress disorder, unspecified: Secondary | ICD-10-CM | POA: Insufficient documentation

## 2012-10-13 DIAGNOSIS — F411 Generalized anxiety disorder: Secondary | ICD-10-CM

## 2012-10-13 DIAGNOSIS — S20219A Contusion of unspecified front wall of thorax, initial encounter: Secondary | ICD-10-CM
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
## 2012-10-19 ENCOUNTER — Ambulatory Visit: Admitting: Neurology

## 2012-10-20 ENCOUNTER — Ambulatory Visit: Payer: Self-pay | Admitting: Neurology

## 2012-10-24 ENCOUNTER — Other Ambulatory Visit: Payer: Self-pay | Admitting: Family Medicine

## 2012-10-26 ENCOUNTER — Other Ambulatory Visit: Payer: Self-pay | Admitting: Family Medicine

## 2012-10-26 ENCOUNTER — Telehealth: Payer: Self-pay | Admitting: Family Medicine

## 2012-10-26 NOTE — Telephone Encounter (Signed)
Pt needs refill hydrocodone 10-325 mg and lorazepam 1 mg call into [facility]. Pt would like to let MD know that [facility] dismissed allowing [facility] into  PTSD program. Pt will appeal they discuss. PT said [facility] stated his cognitive ability is impaired

## 2012-10-26 NOTE — Telephone Encounter (Signed)
No refills on narcotics because he does not need them. He is at high risk of getting addicted to them. He should have plenty of refills left on the Lorazepam. He needs to get involved with a psychiatrist ASAP, whether this is with the [facility] or not.

## 2012-10-27 NOTE — Telephone Encounter (Signed)
I have tried to reach pt by phone, no answer or option to leave a message.

## 2012-10-29 ENCOUNTER — Other Ambulatory Visit: Payer: Non-veteran care

## 2012-11-02 ENCOUNTER — Ambulatory Visit (INDEPENDENT_AMBULATORY_CARE_PROVIDER_SITE_OTHER)
Admission: RE | Admit: 2012-11-02 | Discharge: 2012-11-02 | Disposition: A | Discharge status: Home / Self Care | Payer: Non-veteran care | Source: Ambulatory Visit | Attending: Family Medicine | Admitting: Family Medicine

## 2012-11-02 DIAGNOSIS — S060X9D Concussion with loss of consciousness of unspecified duration, subsequent encounter: Secondary | ICD-10-CM

## 2012-11-02 DIAGNOSIS — Z5189 Encounter for other specified aftercare: Secondary | ICD-10-CM

## 2012-11-04 NOTE — Progress Notes (Signed)
Quick Note:  I spoke with pt and gave results. ______ 

## 2012-11-05 ENCOUNTER — Encounter: Payer: Self-pay | Admitting: Family Medicine

## 2012-11-05 ENCOUNTER — Ambulatory Visit (INDEPENDENT_AMBULATORY_CARE_PROVIDER_SITE_OTHER): Payer: Non-veteran care | Admitting: Family Medicine

## 2012-11-05 ENCOUNTER — Ambulatory Visit: Payer: Self-pay | Admitting: Neurology

## 2012-11-05 ENCOUNTER — Telehealth: Payer: Self-pay | Admitting: Family Medicine

## 2012-11-05 VITALS — BP 132/72 | HR 68 | Temp 98.3°F | Ht 70.0 in | Wt 147.0 lb

## 2012-11-05 DIAGNOSIS — S61210A Laceration without foreign body of right index finger without damage to nail, initial encounter: Secondary | ICD-10-CM

## 2012-11-05 DIAGNOSIS — S61209A Unspecified open wound of unspecified finger without damage to nail, initial encounter: Secondary | ICD-10-CM

## 2012-11-05 MED ORDER — DOXYCYCLINE HYCLATE 100 MG PO TABS: 100.0000 mg | ORAL_TABLET | Freq: Two times a day (BID) | ORAL | Status: DC

## 2012-11-05 NOTE — Telephone Encounter (Signed)
Pt scheduled today w/ Dr. Caryl Never - ok'd by MD. Encounter closed.

## 2012-11-05 NOTE — Patient Instructions (Addendum)
Keep wound dry for the first 24 hours then clean daily with soap and water for one week. Apply topical antibiotic daily for 3-4 days. Keep covered with clean dressing for 4-5 days. Follow up promptly for any signs of infection such as redness, warmth, pain, or drainage. Return in 10 days for suture removal

## 2012-11-05 NOTE — Telephone Encounter (Signed)
Caller Name: Josmar  Phone: [phone]  Patient: James Barrett, James Barrett  Gender: Male  DOB: 01-30-1986  Age: 27 Years  PCP: Gershon Crane [facility])   Does the office need to follow up with this patient?: Yes  Instructions For The Office: reccomendations if someone in office will stitch the finger  RN Note:  went to [facility], [facility], unable to drive to [place] where he is normally seen; cut is a deep wound. Calling to see if anyone in the office can provide stitches (states unable to go into the ED).  Please call [phone].   Reason For Call & Symptoms: emergent call, index finger right hand, cut self with pocket knife, thinks finger is about to "come off"  Reviewed Health History In EMR: Yes  Reviewed Medications In EMR: Yes  Reviewed Allergies In EMR: Yes  Reviewed Surgeries / Procedures: Yes  Date of Onset of Symptoms: 11/05/2012  Guideline(s) Used:  Finger Injury  Disposition Per Guideline:  Go to ED Now (or to Office with PCP Approval)  Reason For Disposition Reached:  Skin is split open or gaping (length > 1/2 inch or 12 mm)  Advice Given:  Call Back If:  Pain becomes severe;  You become worse.  Patient Will Follow Care Advice:  YES
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
## 2012-11-05 NOTE — Telephone Encounter (Signed)
Please advise re work-in vs ED

## 2012-11-06 ENCOUNTER — Other Ambulatory Visit (HOSPITAL_COMMUNITY): Payer: Self-pay | Admitting: Internal Medicine

## 2012-11-06 ENCOUNTER — Other Ambulatory Visit (HOSPITAL_COMMUNITY): Payer: Self-pay | Admitting: Physician Assistant

## 2012-11-06 DIAGNOSIS — R4020 Unspecified coma: Secondary | ICD-10-CM

## 2012-11-06 DIAGNOSIS — T1490XA Injury, unspecified, initial encounter: Secondary | ICD-10-CM

## 2012-11-10 ENCOUNTER — Other Ambulatory Visit (HOSPITAL_COMMUNITY): Payer: Non-veteran care

## 2012-11-17 ENCOUNTER — Encounter: Payer: Self-pay | Admitting: Neurology

## 2012-11-17 ENCOUNTER — Ambulatory Visit (INDEPENDENT_AMBULATORY_CARE_PROVIDER_SITE_OTHER): Payer: Non-veteran care | Admitting: Neurology

## 2012-11-17 VITALS — BP 90/60 | HR 96 | Temp 98.4°F | Ht 70.0 in | Wt 143.0 lb

## 2012-11-17 DIAGNOSIS — R561 Post traumatic seizures: Secondary | ICD-10-CM

## 2012-11-17 DIAGNOSIS — S060X9A Concussion with loss of consciousness of unspecified duration, initial encounter: Secondary | ICD-10-CM
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
## 2012-11-17 NOTE — Patient Instructions (Signed)
1.  From my standpoint, I think you are medically stable. 2.  Since you had an injury of the brain that caused a seizure, I would continue an anti-epileptic medication for at least two years, since there is small scarring that could cause another seizure.  I would use either Trileptal or Lamictal.  Both treat seizures well, and they also help with mood.  If you decide to start a medication, please let me know and we will have to schedule followup appointment as well

## 2012-11-24 ENCOUNTER — Ambulatory Visit (HOSPITAL_COMMUNITY): Admission: RE | Admit: 2012-11-24 | Payer: Non-veteran care | Source: Ambulatory Visit

## 2013-01-18 ENCOUNTER — Ambulatory Visit: Payer: Non-veteran care | Admitting: Family Medicine

## 2013-02-04 ENCOUNTER — Telehealth: Payer: Self-pay | Admitting: Family Medicine

## 2013-02-04 NOTE — Telephone Encounter (Signed)
Pt stated he lost his prednisone medication. [facility] [address]

## 2013-02-05 MED ORDER — METHYLPREDNISOLONE 4 MG PO KIT: PACK | ORAL | Status: DC

## 2013-02-05 NOTE — Telephone Encounter (Signed)
Call in a Medrol dose pack  

## 2013-02-05 NOTE — Telephone Encounter (Signed)
Tried reaching the patient at below listed number.  Received a message that his voicemail box has not been set up.  Will need to try again later.

## 2013-02-05 NOTE — Telephone Encounter (Signed)
I sent script e-scribe and spoke with pt. 

## 2013-02-05 NOTE — Telephone Encounter (Signed)
I spoke further to the pt.  He states he understands that it was a short term medication.  It was given to him for his back pain.  He continues to have back pain.  Prednisone helped.  He would like another rx.  Explained that prednisone is not a medication that should be used for long term use if possible.  Also told him that he may need to come in to talk further with SF since back pain has continued.  He will wait on phone call.

## 2013-02-05 NOTE — Telephone Encounter (Signed)
This was a one time taper back in July,  he is not supposed to be on prednisone now

## 2013-02-05 NOTE — Telephone Encounter (Signed)
Pt following up on request pls call

## 2013-02-20 ENCOUNTER — Encounter (HOSPITAL_COMMUNITY): Payer: Self-pay | Admitting: Emergency Medicine

## 2013-02-20 ENCOUNTER — Emergency Department (HOSPITAL_COMMUNITY)
Admission: EM | Admit: 2013-02-20 | Discharge: 2013-02-20 | Disposition: A | Discharge status: Home / Self Care | Payer: Non-veteran care | Attending: Emergency Medicine | Admitting: Emergency Medicine

## 2013-02-20 DIAGNOSIS — R42 Dizziness and giddiness: Secondary | ICD-10-CM | POA: Insufficient documentation

## 2013-02-20 DIAGNOSIS — I498 Other specified cardiac arrhythmias: Secondary | ICD-10-CM | POA: Insufficient documentation

## 2013-02-20 DIAGNOSIS — I471 Supraventricular tachycardia: Secondary | ICD-10-CM

## 2013-02-20 DIAGNOSIS — Z87828 Personal history of other (healed) physical injury and trauma: Secondary | ICD-10-CM | POA: Insufficient documentation

## 2013-02-20 DIAGNOSIS — F411 Generalized anxiety disorder: Secondary | ICD-10-CM | POA: Insufficient documentation

## 2013-02-20 DIAGNOSIS — Z8669 Personal history of other diseases of the nervous system and sense organs: Secondary | ICD-10-CM | POA: Insufficient documentation

## 2013-02-20 DIAGNOSIS — Z880 Allergy status to penicillin: Secondary | ICD-10-CM | POA: Insufficient documentation

## 2013-02-20 DIAGNOSIS — F17200 Nicotine dependence, unspecified, uncomplicated: Secondary | ICD-10-CM | POA: Insufficient documentation

## 2013-02-20 DIAGNOSIS — R0789 Other chest pain: Secondary | ICD-10-CM | POA: Insufficient documentation

## 2013-02-20 LAB — RAPID URINE DRUG SCREEN, HOSP PERFORMED
Amphetamines: NOT DETECTED
Barbiturates: NOT DETECTED

## 2013-02-20 LAB — CBC WITH DIFFERENTIAL/PLATELET
Basophils Absolute: 0.1 10*3/uL (ref 0.0–0.1)
Basophils Relative: 0 % (ref 0–1)
Eosinophils Absolute: 0.2 10*3/uL (ref 0.0–0.7)
Eosinophils Relative: 1 % (ref 0–5)
MCH: 32.6 pg (ref 26.0–34.0)
MCHC: 35.9 g/dL (ref 30.0–36.0)
MCV: 90.8 fL (ref 78.0–100.0)
Platelets: 240 10*3/uL (ref 150–400)
RDW: 12.9 % (ref 11.5–15.5)
WBC: 12.3 10*3/uL — ABNORMAL HIGH (ref 4.0–10.5)

## 2013-02-20 LAB — URINALYSIS, ROUTINE W REFLEX MICROSCOPIC
Bilirubin Urine: NEGATIVE
Ketones, ur: 15 mg/dL — AB
Leukocytes, UA: NEGATIVE
Nitrite: NEGATIVE
Protein, ur: NEGATIVE mg/dL

## 2013-02-20 LAB — BASIC METABOLIC PANEL
Calcium: 9.4 mg/dL (ref 8.4–10.5)
Creatinine, Ser: 1.04 mg/dL (ref 0.50–1.35)
GFR calc non Af Amer: >90 mL/min (ref >90)
Sodium: 141 mEq/L (ref 135–145)

## 2013-02-20 LAB — TROPONIN I: Troponin I: <0.3 ng/mL (ref <0.30)

## 2013-02-20 MED ORDER — SODIUM CHLORIDE 0.9 % IV BOLUS (SEPSIS)
1000.0000 mL | Freq: Once | INTRAVENOUS | Status: AC
  Administered 2013-02-20: 1000 mL via INTRAVENOUS

## 2013-02-20 MED ORDER — ADENOSINE 6 MG/2ML IV SOLN
12.0000 mg | Freq: Once | INTRAVENOUS | Status: DC
  Filled 2013-02-20: qty 4

## 2013-02-20 NOTE — ED Notes (Signed)
Lab out of room, into room, HR 104, pt alert & anxious, cooperative & interactive, c/o chest palpitations & pressure, IV started, Dr. Rhunette Croft into room, carotid massage done by EDP with no immediate response, vagal maneuvers attempted, pt coached in bearing down with slow response, HR done from 196 to 115. EKG repeated, pt "feels better", VSS. IVF

## 2013-02-20 NOTE — ED Notes (Signed)
Pt states he is here as a visitor and his heart started racing approx 20 min ago. Also reports R ear pain after argument on telephone.  Pt very anxious on triage exam.

## 2013-02-20 NOTE — ED Provider Notes (Addendum)
CSN: [id number]     Arrival date & time 02/20/13  0012 History   First MD Initiated Contact with Patient 02/20/13 0018     Chief Complaint  Patient presents with  . Anxiety   (Consider location/radiation/quality/duration/timing/severity/associated sxs/prior Treatment) HPI Comments: 27 y/o with hx of anxiety, PTSD, alcohol abuse in the past comes in with cc of tachycardia/palpitations. Pt was out having a drink, when he all of a sudden started having some pain in his right ear, and started feeling that his heart was racing. He has no hx of SVT, and denies any substance abuse today. No hx of stimulant use. With the palpitations, patient does have some dizziness when he gets up.    Patient is a 27 y.o. male presenting with anxiety. The history is provided by the patient.  Anxiety Pertinent negatives include no chest pain, no headaches and no shortness of breath.    Past Medical History  Diagnosis Date  . Anxiety   . PTSD (post-traumatic stress disorder)   . Depression   . 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 Alcohol abuse, episodic drinking behavior   . 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 Knee surgery      on right, had ACL and MCL repairs, torn meniscus    Family History  Problem Relation Age of Onset  . Ataxia Neg Hx   . Chorea Neg Hx   . Dementia Neg Hx   . Mental retardation Neg Hx   . Migraines Neg Hx   . Multiple sclerosis Neg Hx   . Neurofibromatosis Neg Hx   . Neuropathy Neg Hx   . Parkinsonism Neg Hx   . Seizures Neg Hx   . Stroke Neg Hx    History  Substance Use Topics  . Smoking status: Current Every Day Smoker -- 1.00 packs/day for 11 years    Types: Cigarettes  . Smokeless tobacco: Never Used  . Alcohol Use: 0.0 oz/week    Review of Systems  Constitutional: Negative for fever, chills and activity change.  Eyes: Negative for visual disturbance.  Respiratory: Positive for chest tightness.  Negative for cough and shortness of breath.   Cardiovascular: Negative for chest pain.  Gastrointestinal: Negative for abdominal distention.  Genitourinary: Negative for dysuria, enuresis and difficulty urinating.  Musculoskeletal: Negative for arthralgias and neck pain.  Neurological: Positive for dizziness. Negative for light-headedness and headaches.  Psychiatric/Behavioral: Negative for confusion.    Allergies  Penicillins  Home Medications  No current outpatient prescriptions on file. BP 111/78  Pulse 108  Temp(Src) 98.3 F (36.8 C) (Oral)  Resp 19  SpO2 98% Physical Exam  Nursing note and vitals reviewed. Constitutional: He is oriented to person, place, and time. He appears well-developed.  HENT:  Head: Normocephalic and atraumatic.  Eyes: Conjunctivae and EOM are normal. Pupils are equal, round, and reactive to light.  Neck: Normal range of motion. Neck supple. No JVD present.  Cardiovascular: Regular rhythm.   No murmur heard. Pulmonary/Chest: Effort normal and breath sounds normal.  Abdominal: Soft. Bowel sounds are normal. He exhibits no distension. There is no tenderness. There is no rebound and no guarding.  Musculoskeletal: He exhibits no edema and no tenderness.  Neurological: He is alert and oriented to person, place, and time.  Skin: Skin is warm.    ED Course  Procedures (including critical care time) Labs Review Labs Reviewed  CBC WITH DIFFERENTIAL - Abnormal; Notable  for the following:    WBC 12.3 (*)    Hemoglobin 18.4 (*)    Lymphs Abs 5.0 (*)    All other components within normal limits  BASIC METABOLIC PANEL  TROPONIN I  URINALYSIS, ROUTINE W REFLEX MICROSCOPIC  URINE RAPID DRUG SCREEN (HOSP PERFORMED)   Imaging Review No results found.  EKG Interpretation     Ventricular Rate:  196 PR Interval:    QRS Duration: 74 QT Interval:  220 QTC Calculation: 397 R Axis:   80 Text Interpretation:  Supraventricular tachycardia Nonspecific ST  abnormality Abnormal ECG New findings            MDM  No diagnosis found.  Pt comes in with cc of anxiety, palpitations. Was drinking, when his sx started all of a sudden. Denies any illicit substance use, stimulant use. No hx of DVT, PE, had some knee surgeries on 2010. Pt was doing well, until suddenly the palpitations started.  Pt was concerted to sinus rhythm with just some vagal maneuvers, as he beared down.  Labs had been ordered and are pending, some dehydration based on the factt hat he appears hemoconcentrated.  Pt was extremely anxious while he was here. I am not sure what the underlying cause is, but given his underlying anxiety, him not taking any meds, i don't think it will the best idea to send him home with metoprolol.    Derwood Kaplan, MD 02/20/13 0116  1:46 AM Labs WNL. Pt removed his IV, wants to go. UDS pending, but it is not going to alter management. Will d.c  Derwood Kaplan, MD 02/20/13 0146   Date: 02/20/2013  Rate: 109  Rhythm: normal sinus rhythm  QRS Axis: normal  Intervals: normal  ST/T Wave abnormalities: normal  Conduction Disutrbances: none  Narrative Interpretation: unremarkable      Derwood Kaplan, MD 02/20/13 0150

## 2013-02-20 NOTE — ED Notes (Signed)
EDP into room to speak with and update pt, pt dressed self and is ready to go, pacing around room, alert, NAD, "claustrophobic". HR 96 manually.

## 2013-02-21 ENCOUNTER — Encounter (HOSPITAL_COMMUNITY): Payer: Self-pay | Admitting: Emergency Medicine

## 2013-02-21 ENCOUNTER — Emergency Department (HOSPITAL_COMMUNITY)
Admission: EM | Admit: 2013-02-21 | Discharge: 2013-02-22 | Disposition: A | Discharge status: Home / Self Care | Payer: Non-veteran care | Attending: Emergency Medicine | Admitting: Emergency Medicine

## 2013-02-21 DIAGNOSIS — R454 Irritability and anger: Secondary | ICD-10-CM

## 2013-02-21 DIAGNOSIS — R4689 Other symptoms and signs involving appearance and behavior: Secondary | ICD-10-CM

## 2013-02-21 DIAGNOSIS — F911 Conduct disorder, childhood-onset type: Secondary | ICD-10-CM | POA: Insufficient documentation

## 2013-02-21 DIAGNOSIS — F4310 Post-traumatic stress disorder, unspecified: Secondary | ICD-10-CM

## 2013-02-21 DIAGNOSIS — Z8669 Personal history of other diseases of the nervous system and sense organs: Secondary | ICD-10-CM | POA: Insufficient documentation

## 2013-02-21 DIAGNOSIS — F10229 Alcohol dependence with intoxication, unspecified: Secondary | ICD-10-CM

## 2013-02-21 DIAGNOSIS — Z87820 Personal history of traumatic brain injury: Secondary | ICD-10-CM | POA: Insufficient documentation

## 2013-02-21 DIAGNOSIS — Z880 Allergy status to penicillin: Secondary | ICD-10-CM | POA: Insufficient documentation

## 2013-02-21 DIAGNOSIS — F439 Reaction to severe stress, unspecified: Secondary | ICD-10-CM

## 2013-02-21 DIAGNOSIS — F17200 Nicotine dependence, unspecified, uncomplicated: Secondary | ICD-10-CM | POA: Insufficient documentation

## 2013-02-21 DIAGNOSIS — I471 Supraventricular tachycardia: Secondary | ICD-10-CM | POA: Diagnosis not present

## 2013-02-21 DIAGNOSIS — IMO0002 Reserved for concepts with insufficient information to code with codable children: Secondary | ICD-10-CM | POA: Insufficient documentation

## 2013-02-21 DIAGNOSIS — F1010 Alcohol abuse, uncomplicated: Secondary | ICD-10-CM

## 2013-02-21 DIAGNOSIS — X838XXA Intentional self-harm by other specified means, initial encounter: Secondary | ICD-10-CM | POA: Insufficient documentation

## 2013-02-21 LAB — URINALYSIS, ROUTINE W REFLEX MICROSCOPIC
Bilirubin Urine: NEGATIVE
Ketones, ur: NEGATIVE mg/dL
Leukocytes, UA: NEGATIVE
Nitrite: NEGATIVE
Specific Gravity, Urine: 1.012 (ref 1.005–1.030)
Urobilinogen, UA: 0.2 mg/dL (ref 0.0–1.0)
pH: 6 (ref 5.0–8.0)

## 2013-02-21 LAB — RAPID URINE DRUG SCREEN, HOSP PERFORMED
Amphetamines: NOT DETECTED
Barbiturates: NOT DETECTED
Benzodiazepines: NOT DETECTED
Cocaine: NOT DETECTED
Opiates: NOT DETECTED
Tetrahydrocannabinol: NOT DETECTED

## 2013-02-21 LAB — URINE MICROSCOPIC-ADD ON

## 2013-02-21 MED ORDER — LORAZEPAM 2 MG/ML IJ SOLN
2.0000 mg | Freq: Once | INTRAMUSCULAR | Status: DC
  Filled 2013-02-21: qty 1

## 2013-02-21 MED ORDER — ZIPRASIDONE MESYLATE 20 MG IM SOLR
20.0000 mg | Freq: Once | INTRAMUSCULAR | Status: AC
  Administered 2013-02-21: 20 mg via INTRAMUSCULAR
  Filled 2013-02-21: qty 20

## 2013-02-21 MED ORDER — STERILE WATER FOR INJECTION IJ SOLN
INTRAMUSCULAR | Status: AC
  Administered 2013-02-21: 10 mL
  Filled 2013-02-21: qty 10

## 2013-02-21 MED ORDER — HALOPERIDOL LACTATE 5 MG/ML IJ SOLN
5.0000 mg | Freq: Once | INTRAMUSCULAR | Status: DC
  Filled 2013-02-21: qty 1

## 2013-02-21 MED ORDER — LORAZEPAM 2 MG/ML IJ SOLN: 1.0000 mg | Freq: Once | INTRAMUSCULAR | Status: DC
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
## 2013-02-21 NOTE — ED Notes (Signed)
Pt arrived in custody of the police. Pt is aggressive and combative. Pt is in handcuffs and restraints. Pt was in this facility previously for SVT.

## 2013-02-22 DIAGNOSIS — F439 Reaction to severe stress, unspecified: Secondary | ICD-10-CM | POA: Diagnosis present

## 2013-02-22 DIAGNOSIS — F911 Conduct disorder, childhood-onset type: Secondary | ICD-10-CM

## 2013-02-22 DIAGNOSIS — I471 Supraventricular tachycardia: Secondary | ICD-10-CM | POA: Diagnosis not present

## 2013-02-22 DIAGNOSIS — F4320 Adjustment disorder, unspecified: Secondary | ICD-10-CM

## 2013-02-22 DIAGNOSIS — F10229 Alcohol dependence with intoxication, unspecified: Secondary | ICD-10-CM

## 2013-02-22 DIAGNOSIS — R454 Irritability and anger: Secondary | ICD-10-CM | POA: Diagnosis present

## 2013-02-22 LAB — CBC WITH DIFFERENTIAL/PLATELET
Eosinophils Absolute: 0.1 10*3/uL (ref 0.0–0.7)
Eosinophils Relative: 1 % (ref 0–5)
HCT: 46.5 % (ref 39.0–52.0)
Hemoglobin: 16.2 g/dL (ref 13.0–17.0)
Lymphocytes Relative: 25 % (ref 12–46)
Lymphs Abs: 2.7 10*3/uL (ref 0.7–4.0)
MCH: 31.2 pg (ref 26.0–34.0)
MCV: 89.6 fL (ref 78.0–100.0)
Monocytes Absolute: 0.7 10*3/uL (ref 0.1–1.0)
Monocytes Relative: 7 % (ref 3–12)
RBC: 5.19 MIL/uL (ref 4.22–5.81)
WBC: 10.7 10*3/uL — ABNORMAL HIGH (ref 4.0–10.5)

## 2013-02-22 LAB — ACETAMINOPHEN LEVEL: Acetaminophen (Tylenol), Serum: <15 ug/mL (ref 10–30)

## 2013-02-22 LAB — COMPREHENSIVE METABOLIC PANEL
ALT: 19 U/L (ref 0–53)
Albumin: 4.1 g/dL (ref 3.5–5.2)
BUN: 10 mg/dL (ref 6–23)
CO2: 23 mEq/L (ref 19–32)
Calcium: 9 mg/dL (ref 8.4–10.5)
GFR calc Af Amer: >90 mL/min (ref >90)
GFR calc non Af Amer: >90 mL/min (ref >90)
Glucose, Bld: 101 mg/dL — ABNORMAL HIGH (ref 70–99)
Total Protein: 7.2 g/dL (ref 6.0–8.3)

## 2013-02-22 LAB — ETHANOL: Alcohol, Ethyl (B): 246 mg/dL — ABNORMAL HIGH (ref 0–11)

## 2013-02-22 MED ORDER — ZOLPIDEM TARTRATE 5 MG PO TABS: 5.0000 mg | ORAL_TABLET | Freq: Every evening | ORAL | Status: DC | PRN

## 2013-02-22 MED ORDER — NICOTINE 21 MG/24HR TD PT24: 21.0000 mg | MEDICATED_PATCH | Freq: Every day | TRANSDERMAL | Status: DC

## 2013-02-22 MED ORDER — ONDANSETRON HCL 4 MG PO TABS: 4.0000 mg | ORAL_TABLET | Freq: Three times a day (TID) | ORAL | Status: DC | PRN

## 2013-02-22 MED ORDER — IBUPROFEN 200 MG PO TABS: 600.0000 mg | ORAL_TABLET | Freq: Three times a day (TID) | ORAL | Status: DC | PRN

## 2013-02-22 NOTE — Consult Note (Signed)
Patient was upset last night due to a recent break-up and his grandmother going to the hospital.  He drank "too much" and was arguing with his mother.  On assessment, calm and cooperative, denies suicidal/homicidal ideations and hallucinations.  He would like to leave to make his follow-up appointment at 4 pm at the [facility].  Dr. Lolly Mustache assessed the patient and agrees to discharge the patient.  Patient seen today.  He like to followup at [facility] and understand he needs medication for his depression.  Patient will be discharged today with a recommendation to see outpatient.  Patient denies any suicidal thoughts or any homicidal thoughts.  He denies any hallucination or any psychosis. I have personally seen the patient and agreed with the findings and involved in the treatment plan. Kathryne Sharper, MD

## 2013-02-22 NOTE — ED Notes (Signed)
Pt IVC 
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
## 2013-02-22 NOTE — BH Assessment (Signed)
Dr. Lolly Mustache signed notice of commitment change which reversed pt's IVC status. Original notice placed in IVC log in psych ED and copy placed in pt's chart.  Evette Cristal, [place] Assessment Counselor

## 2013-02-22 NOTE — ED Notes (Signed)
[employer] at bedside 

## 2013-02-22 NOTE — ED Notes (Signed)
Cleaned and bandage superficial abrasion to right hand.

## 2013-02-22 NOTE — ED Notes (Addendum)
Pt's mother, Sheppard Penton is a contact for the patient. Her number is [phone].  Pt's mother was advised to return to hospital during Psych Ed visiting hours of 9a-9p.

## 2013-02-22 NOTE — ED Notes (Signed)
Pt is awake and alert, pleasant and cooperative. Patient denies HI, SI AH or VH. Discharge vitals 138/89 HR 88 RR 16 and unlabored.  Will continue to monitor for safety. Patient escorted to lobby without incident. T.Melvyn Neth RN

## 2013-02-25 NOTE — Consult Note (Signed)
Agree with plan 

## 2013-02-27 NOTE — ED Provider Notes (Signed)
Medical screening examination/treatment/procedure(s) were performed by non-physician practitioner and as supervising physician I was immediately available for consultation/collaboration.  EKG Interpretation   None         Roney Marion, MD 02/27/13 [phone]

## 2013-09-07 ENCOUNTER — Telehealth: Payer: Self-pay | Admitting: Family Medicine

## 2013-09-07 NOTE — Telephone Encounter (Signed)
Your pt would like for you to accept his little brother as new pt. Pt brother is 29 yrs old and suffers from panic attacks. Pt brother will be self pay. Can I sch?

## 2013-09-08 NOTE — Telephone Encounter (Signed)
James Barrett is very good

## 2013-09-08 NOTE — Telephone Encounter (Signed)
Pt would like to know if you could recommend a phychologist for his brother

## 2013-09-08 NOTE — Telephone Encounter (Signed)
Sorry but I am too full  

## 2013-09-09 NOTE — Telephone Encounter (Signed)
I left a message with below information.  

## 2013-10-11 ENCOUNTER — Ambulatory Visit (INDEPENDENT_AMBULATORY_CARE_PROVIDER_SITE_OTHER): Payer: Non-veteran care | Admitting: Family Medicine

## 2013-10-11 ENCOUNTER — Encounter: Payer: Self-pay | Admitting: Family Medicine

## 2013-10-11 VITALS — BP 128/80 | HR 86 | Temp 98.7°F | Ht 70.0 in | Wt 168.5 lb

## 2013-10-11 DIAGNOSIS — F1010 Alcohol abuse, uncomplicated: Secondary | ICD-10-CM

## 2013-10-11 DIAGNOSIS — N476 Balanoposthitis: Secondary | ICD-10-CM

## 2013-10-11 DIAGNOSIS — N481 Balanitis: Secondary | ICD-10-CM

## 2013-10-11 DIAGNOSIS — F411 Generalized anxiety disorder: Secondary | ICD-10-CM

## 2013-10-11 DIAGNOSIS — F4310 Post-traumatic stress disorder, unspecified: Secondary | ICD-10-CM

## 2013-10-11 MED ORDER — KETOCONAZOLE 2 % EX CREA: 1.0000 "application " | TOPICAL_CREAM | Freq: Two times a day (BID) | CUTANEOUS | Status: DC

## 2013-10-11 MED ORDER — CLONAZEPAM 0.5 MG PO TABS: 0.5000 mg | ORAL_TABLET | Freq: Three times a day (TID) | ORAL | Status: DC | PRN

## 2013-10-11 NOTE — Progress Notes (Signed)
Pre visit review using our clinic review tool, if applicable. No additional management support is needed unless otherwise documented below in the visit note. 

## 2013-10-11 NOTE — Progress Notes (Signed)
   Subjective:    Patient ID: James Barrett, male    DOB: 08/11/1985, 28 y.o.   MRN: [id number]  HPI Here asking for advice. He has a long hx of alcohol abuse, anxiety and PTSD and he has struggled to get his alcohol problem under control. He has been averaging drinking anywhere from 7 to 20 beers every day for the past year, at least until recently. He has been trying trying to exericse daily and he is taking an OTC supplement with 5-HTP in it which is supposed to boost serotonin levels. He is proud to say that he has been sober now for the past 73 hours. He has used Ativan before which was supplied by [employer] doctors and he asks if we could prescribe something like that. He has trouble sleeping. He has been living in [place] but he is temporarily staying with his mother here in [place]. He also asks for help with a rash on the penis that has been present for months. It gets better with cortisone creams and then comes back.    Review of Systems  Constitutional: Negative.   Skin: Positive for rash.  Neurological: Negative.   Psychiatric/Behavioral: Positive for behavioral problems, sleep disturbance and agitation. Negative for hallucinations, confusion and dysphoric mood. The patient is nervous/anxious.        Objective:   Physical Exam  Constitutional: He is oriented to person, place, and time. He appears well-developed and well-nourished.  Cardiovascular: Normal rate, regular rhythm, normal heart sounds and intact distal pulses.   Pulmonary/Chest: Effort normal and breath sounds normal.  Neurological: He is alert and oriented to person, place, and time.  Psychiatric: He has a normal mood and affect. His behavior is normal. Thought content normal.          Assessment & Plan:  Start on Clonazepam to use TID prn. I encouraged him to stay off alcohol entirely. Try ketoconazole cream.

## 2013-10-21 ENCOUNTER — Telehealth: Payer: Self-pay | Admitting: Family Medicine

## 2013-10-21 NOTE — Telephone Encounter (Signed)
Patient called to see if any appointments available for 10/21/13; he has one for 10/22/13 at 12:45 pm.  He declined triage.    Advised none available at this location.  He declined to check at other locations.  Note to office as information only.

## 2013-10-21 NOTE — Telephone Encounter (Signed)
Patient Information:  Caller Name: Molli HazardMatthew  Phone: [phone]  Patient: James MulderOconnor, James  Gender: Male  DOB: 07/13/1985  Age: 28 Years  PCP: Gershon CraneFry, [facility](Family Practice)  Office Follow Up:  Does the office need to follow up with this patient?: No  Instructions For The Office: N/A   Symptoms  Reason For Call & Symptoms: Redness to skin on penis at this time. Has tested negative for STD's. Was given Ketonocazole cream.  Reviewed Health History In EMR: Yes  Reviewed Medications In EMR: Yes  Reviewed Allergies In EMR: Yes  Reviewed Surgeries / Procedures: Yes  Date of Onset of Symptoms: 10/11/2013  Treatments Tried: Prescription cream  Treatments Tried Worked: No  Guideline(s) Used:  Rash or Redness - Localized  Disposition Per Guideline:   See Within 3 Days in Office  Reason For Disposition Reached:   Localized rash present > 7 days  Advice Given:  N/A  Patient Will Follow Care Advice:  YES  Appointment Scheduled:  10/22/2013 12:45:00 Appointment Scheduled Provider:  Eleonore ChiquitoKwiatkowski, [name] (Family Practice > 29yrs old)

## 2013-10-21 NOTE — Telephone Encounter (Signed)
Noted  

## 2013-10-22 ENCOUNTER — Encounter: Payer: Self-pay | Admitting: Internal Medicine

## 2013-10-22 ENCOUNTER — Ambulatory Visit (INDEPENDENT_AMBULATORY_CARE_PROVIDER_SITE_OTHER): Payer: Non-veteran care | Admitting: Internal Medicine

## 2013-10-22 VITALS — BP 118/80 | HR 80 | Temp 98.0°F | Wt 167.0 lb

## 2013-10-22 DIAGNOSIS — E46 Unspecified protein-calorie malnutrition: Secondary | ICD-10-CM

## 2013-10-22 DIAGNOSIS — F4310 Post-traumatic stress disorder, unspecified: Secondary | ICD-10-CM

## 2013-10-22 DIAGNOSIS — F411 Generalized anxiety disorder: Secondary | ICD-10-CM

## 2013-10-22 MED ORDER — CYANOCOBALAMIN 1000 MCG/ML IJ SOLN
1000.0000 ug | Freq: Once | INTRAMUSCULAR | Status: AC
  Administered 2013-10-22: 1000 ug via INTRAMUSCULAR

## 2013-10-22 MED ORDER — CYANOCOBALAMIN 1000 MCG/ML IJ SOLN
1000.0000 ug | Freq: Once | INTRAMUSCULAR | Status: DC
Start: 2013-10-22 — End: 2013-10-22

## 2013-10-22 MED ORDER — FLUCONAZOLE 150 MG PO TABS: ORAL_TABLET | ORAL | Status: DC

## 2013-10-22 NOTE — Progress Notes (Signed)
Subjective:    Patient ID: James SporeMatthew K Barrett, male    DOB: 05/22/1985, 28 y.o.   MRN: [id number]  HPI  28 year old patient who is seen today with a chief complaint of a penile rash.  This has been present at least intermittently for the past 4 months.  He states that he has complete resolution with topical 1% hydrocortisone.  He has completed 2 weeks of ketoconazole, but still is concerned about a recurrent penile rash.  He does give a history of unprotected sexual activity, but no other lesions.  He declines HIV testing today  Past Medical History  Diagnosis Date  . Anxiety   . PTSD (post-traumatic stress disorder)   . Depression   . 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 Alcohol abuse, episodic drinking behavior   . 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 Marital Status: Single    Spouse Name: N/A    Number of Children: N/A  . Years of Education: N/A   Occupational History  . Not on file.   Social History Main Topics  . Smoking status: Former Smoker -- 1.00 packs/day for 11 years    Types: Cigarettes    Quit date: 04/12/2013  . Smokeless tobacco: Never Used  . Alcohol Use: 0.0 oz/week  . Drug Use: Yes    Special: Marijuana     Comment: occasionally  . Sexual Activity: Not on file   Other Topics Concern  . Not on file   Social History Narrative  . No narrative on file    Past Surgical History  Procedure Laterality Date  . Knee surgery      on right, had ACL and MCL repairs, torn meniscus     Family History  Problem Relation Age of Onset  . Ataxia Neg Hx   . Chorea Neg Hx   . Dementia Neg Hx   . Mental retardation Neg Hx   . Migraines Neg Hx   . Multiple sclerosis Neg Hx   . Neurofibromatosis Neg Hx   . Neuropathy Neg Hx   . Parkinsonism Neg Hx   . Seizures Neg Hx   . Stroke Neg Hx     Allergies  Allergen Reactions  . Penicillins Anaphylaxis    Current Outpatient Prescriptions on File Prior to Visit    Medication Sig Dispense Refill  . clonazePAM (KLONOPIN) 0.5 MG tablet Take 1 tablet (0.5 mg total) by mouth 3 (three) times daily as needed for anxiety.  90 tablet  2  . ketoconazole (NIZORAL) 2 % cream Apply 1 application topically 2 (two) times daily.  30 g  2   No current facility-administered medications on file prior to visit.    BP 118/80  Pulse 80  Temp(Src) 98 F (36.7 C) (Oral)  Wt 167 lb (75.751 kg)       Review of Systems  Skin: Positive for rash.       Objective:   Physical Exam  Genitourinary: Penis normal.  The glans penis appeared to be essentially normal.  There was some mild flaking noted.  There is also some very mild, nonspecific discoloration of the penile shaft          Assessment & Plan:   Possible healed balantitis.  He is still quite anxious about a residual fungal infection.  Have elected to treat with oral fluconazole (his suggestion also).  Probably will need dermatologic referral for reassurance History of tobacco abuse.  Remains abstinent 

## 2013-10-22 NOTE — Progress Notes (Signed)
Pre visit review using our clinic review tool, if applicable. No additional management support is needed unless otherwise documented below in the visit note. 

## 2013-10-22 NOTE — Patient Instructions (Signed)
Call or return to clinic prn if these symptoms worsen or fail to improve as anticipated.

## 2013-10-26 ENCOUNTER — Telehealth: Payer: Self-pay | Admitting: Family Medicine

## 2013-10-26 NOTE — Telephone Encounter (Signed)
Pt saw dr Kirtland Bouchardk on 10-22-13. Pt is requesting high power [facility] [place] [phone]. Pt stated he can not continue to use 1% hydrocortisone cream. Pt is aware dr fry is out of office

## 2013-10-26 NOTE — Telephone Encounter (Signed)
Please advise 

## 2013-10-26 NOTE — Telephone Encounter (Signed)
I do not feel that a high potency steroid is appropriate; ask  patient to consider dermatology referral or follow up with Dr. Clent RidgesFry

## 2013-10-27 NOTE — Telephone Encounter (Signed)
Spoke to pt told him Dr. Amador CunasKwiatkowski said he does not feel that a high potency steroid is appropriate; ask you to consider dermatology referral or follow up with Dr. Clent RidgesFry. Pt verbalized understanding.

## 2014-01-26 ENCOUNTER — Ambulatory Visit (INDEPENDENT_AMBULATORY_CARE_PROVIDER_SITE_OTHER): Payer: Non-veteran care | Admitting: Family Medicine

## 2014-01-26 ENCOUNTER — Encounter: Payer: Self-pay | Admitting: Family Medicine

## 2014-01-26 VITALS — BP 123/67 | HR 110 | Temp 99.1°F | Ht 70.0 in | Wt 152.0 lb

## 2014-01-26 DIAGNOSIS — F1010 Alcohol abuse, uncomplicated: Secondary | ICD-10-CM

## 2014-01-26 DIAGNOSIS — M546 Pain in thoracic spine: Secondary | ICD-10-CM

## 2014-01-26 DIAGNOSIS — F4310 Post-traumatic stress disorder, unspecified: Secondary | ICD-10-CM

## 2014-01-26 DIAGNOSIS — R21 Rash and other nonspecific skin eruption: Secondary | ICD-10-CM

## 2014-01-26 DIAGNOSIS — F411 Generalized anxiety disorder: Secondary | ICD-10-CM

## 2014-01-26 DIAGNOSIS — N489 Disorder of penis, unspecified: Secondary | ICD-10-CM

## 2014-01-26 MED ORDER — BUSPIRONE HCL 15 MG PO TABS: 15.0000 mg | ORAL_TABLET | Freq: Two times a day (BID) | ORAL | Status: DC

## 2014-01-26 MED ORDER — HYDROCODONE-ACETAMINOPHEN 10-325 MG PO TABS: 1.0000 | ORAL_TABLET | Freq: Four times a day (QID) | ORAL | Status: DC | PRN

## 2014-01-26 MED ORDER — BETAMETHASONE DIPROPIONATE 0.05 % EX CREA: TOPICAL_CREAM | Freq: Two times a day (BID) | CUTANEOUS | Status: DC

## 2014-01-26 NOTE — Progress Notes (Signed)
   Subjective:    Patient ID: James Barrett, male    DOB: 12/07/1985, 28 y.o.   MRN: [id number]  HPI Here to follow up. He has done well in general since I saw him, and he is proud to say that he has had no alcohol to drink (except for one "relapse" of drinking for one night) for the past 117 days. He still struggles with anxiety and irritability though, and he has a quick temper. He asks to try Buspar since he has researched this on the web. He has chronic back pain and asks for a small supply of hydrocodone to use prn. Also he still has a chronic red rash on the penis that has responded only cortisone creams.    Review of Systems  Constitutional: Negative.   Skin: Positive for rash.  Psychiatric/Behavioral: Positive for sleep disturbance and agitation. Negative for hallucinations, behavioral problems, confusion, dysphoric mood and decreased concentration. The patient is nervous/anxious.        Objective:   Physical Exam  Constitutional: He is oriented to person, place, and time. He appears well-developed and well-nourished.  Neurological: He is alert and oriented to person, place, and time.  Skin:  Faint red macular areas on the head of the penis   Psychiatric: He has a normal mood and affect. His behavior is normal. Thought content normal.          Assessment & Plan:  Try Buspar to start at 15 mg bid. We may need to titrate up from there. Given Norco for back pain. Try betamethasone cream for possible lichen planus.

## 2014-01-26 NOTE — Progress Notes (Signed)
Pre visit review using our clinic review tool, if applicable. No additional management support is needed unless otherwise documented below in the visit note. 

## 2014-01-27 ENCOUNTER — Telehealth: Payer: Self-pay | Admitting: Family Medicine

## 2014-01-27 NOTE — Telephone Encounter (Signed)
Pt would like a copy of the xray he saw w/ dr fry yesterday. Can he get a copy from us or does he need to go to the hospital?
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
## 2014-01-31 NOTE — Telephone Encounter (Signed)
I cannot do that because the Xrays we looked at show scoliosis, which is a congenital condition NOT related to trauma.

## 2014-01-31 NOTE — Telephone Encounter (Signed)
I tried to reach pt by phone and no answer or option to leave a message. I put a copy of xray report in the mail along with a note with below information.

## 2014-02-10 ENCOUNTER — Telehealth: Payer: Self-pay | Admitting: Family Medicine

## 2014-02-10 NOTE — Telephone Encounter (Signed)
Pt called to say that HYDROcodone-acetaminophen (NORCO) 10-325 MG per tablet makes him feel drunk . Pt is asking for something a little less potent .  Pt is allergic to Tramadol.    Phamacy;   [facility] [address]  [place] [phone]

## 2014-02-10 NOTE — Telephone Encounter (Signed)
There is nothing else to prescribe that is between tramadol and Norco. The next best thing would be Ibuprofen.

## 2014-02-11 NOTE — Telephone Encounter (Signed)
I left a voice message with below information. 

## 2014-04-07 ENCOUNTER — Telehealth: Payer: Self-pay | Admitting: Family Medicine

## 2014-04-08 ENCOUNTER — Encounter: Payer: Self-pay | Admitting: Family Medicine

## 2014-04-08 ENCOUNTER — Ambulatory Visit (INDEPENDENT_AMBULATORY_CARE_PROVIDER_SITE_OTHER): Payer: Non-veteran care | Admitting: Family Medicine

## 2014-04-08 ENCOUNTER — Telehealth: Payer: Self-pay | Admitting: Family Medicine

## 2014-04-08 VITALS — BP 125/82 | HR 83 | Temp 98.6°F | Ht 70.0 in | Wt 151.0 lb

## 2014-04-08 DIAGNOSIS — F4310 Post-traumatic stress disorder, unspecified: Secondary | ICD-10-CM

## 2014-04-08 DIAGNOSIS — F1010 Alcohol abuse, uncomplicated: Secondary | ICD-10-CM

## 2014-04-08 MED ORDER — ARMODAFINIL 150 MG PO TABS: 150.0000 mg | ORAL_TABLET | Freq: Every day | ORAL | Status: DC

## 2014-04-08 MED ORDER — BETAMETHASONE DIPROPIONATE 0.05 % EX CREA: TOPICAL_CREAM | Freq: Two times a day (BID) | CUTANEOUS | Status: DC

## 2014-04-08 MED ORDER — PROVIGIL 100 MG PO TABS: 100.0000 mg | ORAL_TABLET | Freq: Every day | ORAL | Status: DC

## 2014-04-08 NOTE — Progress Notes (Signed)
Pre visit review using our clinic review tool, if applicable. No additional management support is needed unless otherwise documented below in the visit note. 

## 2014-04-08 NOTE — Telephone Encounter (Signed)
error 

## 2014-04-08 NOTE — Telephone Encounter (Signed)
Cancel Provigil and call in Nuvigil 100 mg daily, #30 with 2 rf

## 2014-04-08 NOTE — Telephone Encounter (Signed)
Pt was seen today and received rx  Provigal. Pt needs new rx nuvigil 100 mg instead of provigal. Pt has coupon for nuvigil. Pt is still at [facility]. Please sent to pharm. Pt is aware can not promise rx will be call into pharm today
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
## 2014-04-08 NOTE — Telephone Encounter (Signed)
This does not come in 100 mg, per Dr. Clent RidgesFry change to 150 mg. [facility].
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
## 2014-07-01 ENCOUNTER — Telehealth: Payer: Self-pay | Admitting: Family Medicine

## 2014-07-01 NOTE — Telephone Encounter (Signed)
[facility] - Client TELEPHONE ADVICE RECORD   [facility]     Patient Name: James Barrett Initial Comment Caller states he is needing to know the difference between several medications. Caller states he found a full bottle Oxycodone 5mg  and wants to know if it is the same as Oxy Contin   DOB: 01/01/1986      Nurse Assessment  Nurse: Harlon FlorWhitaker, RN, Darl PikesSusan Date/Time (Eastern Time): 07/01/2014 2:09:31 PM  Confirm and document reason for call. If symptomatic, describe symptoms. ---Caller states he is needing to know the difference between several medications. Caller states he found a full bottle Oxycodone 5mg  and wants to know if it is the same as Oxy Contin These are his meds from [facility] in 2013. Caller states he had a RX Hydrocodone November 2015. He takes narcotics as needed for screws in his knee. He has trauma induced scoliosis . He is about to be referred to a pain medication specialist with the [facility]. He had a family mbr on Oxycontin who got "messed up". He does not want to get addicted. He is having nausea without the narcotics. esp in the early morning . Last appt with Dr Clent RidgesFry was in December 2015.  Has the patient traveled out of the country within the last 30 days? ---No  Does the patient require triage? ---Declined Triage  Please document clinical information provided and list any resource used. ---RN advised caller to contact his PCP for referral to Pain mngt. Caller verbalized understanding,    Guidelines     Guideline Title Affirmed Question Affirmed Notes        Final Disposition User   Clinical Call PlummerWhitaker, RN, Darl PikesSusan

## 2014-07-04 NOTE — Telephone Encounter (Signed)
I can give him advice until [facility]. Oxycodone is short acting and Oxycontin is long acting. The risk of getting addicted is greater with the Oxycontin.

## 2014-07-04 NOTE — Telephone Encounter (Signed)
I left a voice message with below information. 

## 2014-07-28 ENCOUNTER — Encounter: Payer: Self-pay | Admitting: Family Medicine

## 2014-07-28 ENCOUNTER — Ambulatory Visit (INDEPENDENT_AMBULATORY_CARE_PROVIDER_SITE_OTHER): Payer: Non-veteran care | Admitting: Family Medicine

## 2014-07-28 ENCOUNTER — Telehealth: Payer: Self-pay | Admitting: Family Medicine

## 2014-07-28 ENCOUNTER — Other Ambulatory Visit (HOSPITAL_COMMUNITY)
Admission: RE | Admit: 2014-07-28 | Discharge: 2014-07-28 | Disposition: A | Discharge status: Home / Self Care | Payer: Non-veteran care | Source: Ambulatory Visit | Attending: Family Medicine | Admitting: Family Medicine

## 2014-07-28 VITALS — BP 120/72 | HR 81 | Temp 98.7°F | Wt 151.0 lb

## 2014-07-28 DIAGNOSIS — Z202 Contact with and (suspected) exposure to infections with a predominantly sexual mode of transmission: Secondary | ICD-10-CM | POA: Diagnosis not present

## 2014-07-28 DIAGNOSIS — F411 Generalized anxiety disorder: Secondary | ICD-10-CM | POA: Diagnosis not present

## 2014-07-28 DIAGNOSIS — Z113 Encounter for screening for infections with a predominantly sexual mode of transmission: Secondary | ICD-10-CM | POA: Insufficient documentation

## 2014-07-28 LAB — HIV ANTIBODY (ROUTINE TESTING W REFLEX): HIV: NONREACTIVE

## 2014-07-28 MED ORDER — BETAMETHASONE DIPROPIONATE 0.05 % EX CREA: TOPICAL_CREAM | Freq: Two times a day (BID) | CUTANEOUS | Status: DC
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
## 2014-07-28 NOTE — Telephone Encounter (Signed)
Dr. Durene CalHunter has ok'd this and would like for a 4:00 slot to be made for them.

## 2014-07-28 NOTE — Patient Instructions (Signed)
Test for STDs. We will will call with results. May take up to a week.

## 2014-07-28 NOTE — Telephone Encounter (Signed)
Pt has been sch
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
## 2014-07-29 LAB — HSV(HERPES SIMPLEX VRS) I + II AB-IGG

## 2014-07-29 LAB — RPR

## 2014-08-01 ENCOUNTER — Telehealth: Payer: Self-pay | Admitting: Family Medicine

## 2014-08-01 LAB — URINE CYTOLOGY ANCILLARY ONLY
Chlamydia: NEGATIVE
NEISSERIA GONORRHEA: NEGATIVE
Trichomonas: NEGATIVE

## 2014-08-01 NOTE — Telephone Encounter (Signed)
Have you reviewed these results Dr. Hunter? 

## 2014-08-01 NOTE — Telephone Encounter (Signed)
Please call pt today [phone]

## 2014-08-01 NOTE — Telephone Encounter (Signed)
PLEASE NOTE: All timestamps contained within this report are represented as [place] Standard Time. CONFIDENTIALTY NOTICE: This fax transmission is intended only for the addressee. It contains information that is legally privileged, confidential or otherwise protected from use or disclosure. If you are not the intended recipient, you are strictly prohibited from reviewing, disclosing, copying using or disseminating any of this information or taking any action in reliance on or regarding this information. If you have received this fax in error, please notify us immediately by telephone so that we can arrange for its return to us. Phone: [phone], Toll-Free: [phone], Fax: [phone] Page: 1 of 1 Call Id: [id number] [facility] - Client TELEPHONE ADVICE RECORD [facility] Patient Name: James Barrett Gender: Male DOB: 01/19/1986 Age: [age] Y 3 M 24 D Return Phone Number: [phone] (Primary) Address: [address] [place]State[place]Zip: [place] [address] Client [facility] - Client Client Site [facility] - Night Physician Gershon CraneFry, Stephen Contact Type Call Call Type Triage / Clinical Relationship To Patient Self Return Phone Number [phone] (Primary) Chief Complaint Lab Result Initial Comment Caller states wants lab results of blood work from last week; saw Dr Durene CalHunter; Nurse Assessment Nurse: Logan BoresEvans, RN, Efraim KaufmannMelissa Date/Time [name](Eastern Time): 07/30/2014 11:05:36 AM Confirm and document reason for call. If symptomatic, describe symptoms. ---Caller states wants lab results of blood work from last week; saw Dr Durene CalHunter; Has the patient traveled out of the country within the last 30 days? ---Not Applicable Does the patient require triage? ---No Please document clinical information provided and list any resource used. ---Caller reports he had an STD panel done last week, caller informed  unable to access the chart for those results. Caller advised to call back on Monday. Caller verbalized understood this information given. Guidelines Guideline Title Affirmed Question Affirmed Notes Nurse Date/Time (Eastern Time) Disp. Time [name](Eastern Time) Disposition Final User 07/30/2014 11:08:35 AM Clinical Call Yes Logan BoresEvans, RN, Melissa After Care Instructions Given Call Event Type User Date / Time Description

## 2014-08-01 NOTE — Telephone Encounter (Signed)
I would advise him not to take medication without medical supervision. I had asked him to follow up with Dr. Clent RidgesFry and this can be discussed at follow up.

## 2014-08-01 NOTE — Telephone Encounter (Signed)
Pt states he has a strange rash and he treated his self for gc/chlamidia taking 4800mg  of Sulfamethazonol and TMP and the rash went away and everything is looking better. He wanted you to know.

## 2014-08-01 NOTE — Telephone Encounter (Signed)
I postponed the message because his gonorrhea/chlamydia testing was not back yet. It is not allowing me to release the results on mychart until the new results come through.   So : HIV negative Syphilis negative No herpes antibodies (signifying no prior contact)  Keba can you check in on urine cytology ancillary status? I think it is just going to take more time.

## 2014-08-01 NOTE — Telephone Encounter (Signed)
Pt would like results as soon as possible Py saw dr hunter Magdalene Mollythurs

## 2014-08-28 ENCOUNTER — Other Ambulatory Visit: Payer: Self-pay | Admitting: Family Medicine

## 2014-08-30 ENCOUNTER — Other Ambulatory Visit: Payer: Self-pay | Admitting: Family Medicine

## 2014-08-30 NOTE — Telephone Encounter (Signed)
Call in #[phone] with 2 rf 

## 2014-10-11 ENCOUNTER — Telehealth: Payer: Self-pay | Admitting: Family Medicine

## 2014-10-11 NOTE — Telephone Encounter (Signed)
Pt called in to request a note for medical reasons in order to be able to use his walking cane at a public event to avoid breaking event policies. A doctor's not would be required.

## 2014-10-11 NOTE — Telephone Encounter (Signed)
I am not comfortable writing such a note because to my knowledge he does not need a cane.

## 2014-10-12 NOTE — Telephone Encounter (Signed)
I spoke with pt and he already has approval.

## 2014-10-13 ENCOUNTER — Telehealth: Payer: Self-pay | Admitting: Family Medicine

## 2014-10-13 ENCOUNTER — Ambulatory Visit (INDEPENDENT_AMBULATORY_CARE_PROVIDER_SITE_OTHER): Payer: Non-veteran care | Admitting: Family Medicine

## 2014-10-13 ENCOUNTER — Encounter: Payer: Self-pay | Admitting: Family Medicine

## 2014-10-13 VITALS — BP 118/61 | HR 66 | Temp 98.5°F | Ht 70.0 in | Wt 150.0 lb

## 2014-10-13 DIAGNOSIS — F4310 Post-traumatic stress disorder, unspecified: Secondary | ICD-10-CM | POA: Diagnosis not present

## 2014-10-13 DIAGNOSIS — F1010 Alcohol abuse, uncomplicated: Secondary | ICD-10-CM

## 2014-10-13 DIAGNOSIS — M5440 Lumbago with sciatica, unspecified side: Secondary | ICD-10-CM

## 2014-10-13 DIAGNOSIS — M25561 Pain in right knee: Secondary | ICD-10-CM | POA: Diagnosis not present

## 2014-10-13 MED ORDER — METAXALONE 800 MG PO TABS: 800.0000 mg | ORAL_TABLET | Freq: Four times a day (QID) | ORAL | Status: DC

## 2014-10-13 MED ORDER — HYDROCODONE-ACETAMINOPHEN 10-325 MG PO TABS: 1.0000 | ORAL_TABLET | Freq: Four times a day (QID) | ORAL | Status: DC | PRN

## 2014-10-13 NOTE — Progress Notes (Signed)
Pre visit review using our clinic review tool, if applicable. No additional management support is needed unless otherwise documented below in the visit note. 

## 2014-10-13 NOTE — Progress Notes (Signed)
   Subjective:    Patient ID: James Barrett, male    DOB: 11/30/1985, 29 y.o.   MRN: [id number]  HPI Here to follow up and to discuss meds. He is proud to tell me that he is sober and has not touched any alcohol for the past 6 months. He has also stopped smoking and has switched to vaping. He feels much better in general, he has more energy, he sleeps better, etc. He has stopped all psychotropic medications. He still uses some Norco to help with pain in the right knee, but he averages only 1 or 2 a day and he takes these at bedtime. He will be seeing the [place] for the knee, and he anticipates another surgery at some point. He recently had plastic surgery per Dr. Shon Hough for ear pinning. He has [place] moved[place] to [place] to help his grandfather, who has health issues. He asks to try another muscle relaxer for the spasms in his back, since Methocarbamol and Flexeril are too sedating to ise during the day.    Review of Systems  Constitutional: Negative.   Respiratory: Negative.   Cardiovascular: Negative.   Musculoskeletal: Positive for back pain and arthralgias.  Neurological: Negative.   Psychiatric/Behavioral: Negative.        Objective:   Physical Exam  Constitutional: He is oriented to person, place, and time. He appears well-developed and well-nourished.  Cardiovascular: Normal rate, regular rhythm, normal heart sounds and intact distal pulses.   Pulmonary/Chest: Effort normal and breath sounds normal.  Neurological: He is alert and oriented to person, place, and time.  Psychiatric: He has a normal mood and affect. His behavior is normal. Thought content normal.          Assessment & Plan:  We refilled the Norco for the knee pain. Try Skelaxin for the back spasms. [facility]

## 2014-10-13 NOTE — Telephone Encounter (Signed)
Pt call to say that he did not pick up the following medicine metaxalone (SKELAXIN) 800 MG tablet  Pt said this medicine cost $490.00 Is there anything he may try   Pt is asking for a rx for the following med  [facility]  Pharmacy  [facility] [address]

## 2014-10-17 MED ORDER — TRIAMCINOLONE ACETONIDE 0.1 % EX CREA: 1.0000 "application " | TOPICAL_CREAM | Freq: Two times a day (BID) | CUTANEOUS | Status: DC

## 2014-10-17 NOTE — Telephone Encounter (Signed)
I tried to reach pt by phone, no answer. I sent a message through my chart with below information.

## 2014-10-17 NOTE — Telephone Encounter (Signed)
I refilled the ointment. I don't know of any other muscle relaxer to try unless he goes back to Flexeril like in the past

## 2014-10-17 NOTE — Telephone Encounter (Signed)
Pt needs script for ointment.

## 2014-10-19 ENCOUNTER — Telehealth: Payer: Self-pay | Admitting: Family Medicine

## 2014-10-19 NOTE — Telephone Encounter (Signed)
Pt call to say he found out his younger brother has SCABIES. He said he has been seeing you about his issues. Pt is asking if the following med can be called in for him or you may no of something else.  Medicine; IVERMECTIN   Pt said him and his brother and now his mother hs the same exact symptons    Pharmacy [facility]

## 2014-10-20 MED ORDER — IVERMECTIN 1 % EX CREA: 1.0000 "application " | TOPICAL_CREAM | Freq: Two times a day (BID) | CUTANEOUS | Status: DC

## 2014-10-20 NOTE — Telephone Encounter (Signed)
Called and spoke with pt and pt is aware.  Advised pt of directions for the cream and he verbalized understanding.

## 2014-10-20 NOTE — Telephone Encounter (Signed)
Rx sent to [facility]

## 2014-10-20 NOTE — Telephone Encounter (Signed)
Call in Ivermectin 1% cream to apply BID the first day and then repeat 7 days later, add 3 refills

## 2014-10-20 NOTE — Addendum Note (Signed)
Addended by: Azucena Freed on: 10/20/2014 11:42 AM   Modules accepted: Orders

## 2014-11-24 ENCOUNTER — Other Ambulatory Visit: Payer: Self-pay | Admitting: Family Medicine

## 2014-11-24 MED ORDER — ARMODAFINIL 150 MG PO TABS: 150.0000 mg | ORAL_TABLET | Freq: Every day | ORAL | Status: DC

## 2014-11-24 NOTE — Telephone Encounter (Signed)
Called and spoke with pt and pt is aware rx is ready for pick up.  

## 2014-11-24 NOTE — Telephone Encounter (Signed)
done

## 2014-11-24 NOTE — Telephone Encounter (Signed)
Pt is no longer taking any PTSD med. Pt needs a refill on nuvigil 150 mg #30 w/refills call into [facility]. Pt no longer having depression nor anxiety symptoms since starting the med. Pt has no side effect from med.

## 2014-12-29 ENCOUNTER — Telehealth: Payer: Self-pay

## 2014-12-29 MED ORDER — HYDROCODONE-ACETAMINOPHEN 10-325 MG PO TABS: 1.0000 | ORAL_TABLET | Freq: Four times a day (QID) | ORAL | Status: DC | PRN

## 2014-12-29 NOTE — Telephone Encounter (Signed)
done

## 2014-12-29 NOTE — Telephone Encounter (Signed)
Attempted to call pt; number is busy.  Will call at a later time

## 2014-12-29 NOTE — Telephone Encounter (Signed)
Pt called back and the message was relayed.

## 2014-12-29 NOTE — Telephone Encounter (Signed)
The pt called and is hoping to get a refill of his Norco rx  Pt callback - [phone]

## 2015-01-13 ENCOUNTER — Ambulatory Visit: Payer: Non-veteran care | Admitting: Family Medicine

## 2015-01-13 ENCOUNTER — Telehealth: Payer: Self-pay | Admitting: Family Medicine

## 2015-01-13 NOTE — Telephone Encounter (Signed)
Pt was on schedule for today to see Dr. Clent Ridges for follow up. I left a voice message for pt to reschedule, okay per Dr. Clent Ridges.

## 2015-01-23 ENCOUNTER — Ambulatory Visit (INDEPENDENT_AMBULATORY_CARE_PROVIDER_SITE_OTHER): Payer: Non-veteran care | Admitting: Family Medicine

## 2015-01-23 ENCOUNTER — Encounter: Payer: Self-pay | Admitting: Family Medicine

## 2015-01-23 VITALS — BP 124/77 | HR 80 | Temp 98.9°F | Ht 70.0 in | Wt 148.0 lb

## 2015-01-23 DIAGNOSIS — R21 Rash and other nonspecific skin eruption: Secondary | ICD-10-CM | POA: Diagnosis not present

## 2015-01-23 DIAGNOSIS — F329 Major depressive disorder, single episode, unspecified: Secondary | ICD-10-CM | POA: Diagnosis not present

## 2015-01-23 DIAGNOSIS — F32A Depression, unspecified: Secondary | ICD-10-CM

## 2015-01-23 MED ORDER — LAMOTRIGINE 100 MG PO TABS: 50.0000 mg | ORAL_TABLET | Freq: Every day | ORAL | Status: DC

## 2015-01-23 NOTE — Progress Notes (Signed)
   Subjective:    Patient ID: James Barrett, male    DOB: [birth year], 29 y.o.   MRN: [id number]  HPI Here for several issues. First he has been struggling wit mood swings lately like depression and anger. No suicidal thoughts. He has tried several SSRI' sin the past and he does not want to try these again. He asks if there is something else to try. He sleeps well. Appetite is preserved for the most part. He is proud to say that he has avoided any alcohol use for the past 9 months, though he still has the desire to. Also he still has a red rash on the penis and now some rash on the face. He has used antifungal agents and steroid creams with no response. He wonders if he could have lupus.    Review of Systems  Constitutional: Negative.   Respiratory: Negative.   Cardiovascular: Negative.   Skin: Positive for rash.  Psychiatric/Behavioral: Positive for dysphoric mood, decreased concentration and agitation. Negative for suicidal ideas, hallucinations, sleep disturbance and self-injury. The patient is nervous/anxious.        Objective:   Physical Exam  Constitutional: He is oriented to person, place, and time. He appears well-developed and well-nourished.  Cardiovascular: Normal rate, regular rhythm, normal heart sounds and intact distal pulses.   Pulmonary/Chest: Effort normal and breath sounds normal.  Neurological: He is alert and oriented to person, place, and time.  Skin:  There are small areas of macular erythema on both cheeks and well as on the head of the penis   Psychiatric: He has a normal mood and affect. His behavior is normal. Judgment and thought content normal.          Assessment & Plan:  We will refer to Dermatology for the rashes, lupus is a possibility but lichen planus is also possible. He has never been diagnosed with bipolar depression but I an starting to consider this as a diagnosis. Try Lamictal 50 mg qhs and he will report back to [place] in a week.

## 2015-01-23 NOTE — Progress Notes (Signed)
Pre visit review using our clinic review tool, if applicable. No additional management support is needed unless otherwise documented below in the visit note. 

## 2015-01-28 ENCOUNTER — Encounter: Payer: Self-pay | Admitting: Family Medicine

## 2015-02-01 ENCOUNTER — Telehealth: Payer: Self-pay | Admitting: Family Medicine

## 2015-02-01 NOTE — Telephone Encounter (Signed)
James Barrett called today in reference to his visit today with Dr. Cherlyn Roberts. He said that he might as well be flushing his money down the drown because they told him that they don't know what it is. James Barrett wants to be referred to another dermatologist. He has spent a lot of money trying to figure out what is going on and would like an answer.

## 2015-02-03 NOTE — Telephone Encounter (Signed)
Let me get the note from Dr. Terri Piedra so I can see what he said, then we can go from there

## 2015-02-03 NOTE — Telephone Encounter (Signed)
I left a voice message for pt with this information. 

## 2015-02-03 NOTE — Telephone Encounter (Signed)
Fax is on counter for Dr. Clent Ridges to review.

## 2015-02-03 NOTE — Telephone Encounter (Signed)
I did read the note from Dr. Terri Piedra. My only suggestion may be for him to see a dermatologist from an academic center like [facility] or [facility] or [facility]. He would probably want to research this on the Web, and if he finds someone he wants to see we could do the referral.

## 2015-02-06 NOTE — Telephone Encounter (Signed)
I spoke wit pt and declined any further referral for now.

## 2015-02-17 ENCOUNTER — Telehealth: Payer: Self-pay | Admitting: Family Medicine

## 2015-02-17 MED ORDER — HYDROCODONE-ACETAMINOPHEN 10-325 MG PO TABS: 1.0000 | ORAL_TABLET | Freq: Four times a day (QID) | ORAL | Status: DC | PRN

## 2015-02-17 NOTE — Telephone Encounter (Signed)
rx is ready. Yes on the flu shot, everyone should get one

## 2015-02-17 NOTE — Telephone Encounter (Signed)
Pt needs new rx hydrocodone. Pt would like to know with his medical history should he get a flu shot.

## 2015-02-17 NOTE — Telephone Encounter (Signed)
Left message on machine Rx ready for pick up and he should get a flu shot

## 2015-02-17 NOTE — Telephone Encounter (Signed)
Rx last filled on 9..2016 #120. Pls advise.

## 2015-02-21 ENCOUNTER — Telehealth: Payer: Self-pay | Admitting: Family Medicine

## 2015-02-21 NOTE — Telephone Encounter (Signed)
error 

## 2015-03-28 ENCOUNTER — Telehealth: Payer: Self-pay | Admitting: Family Medicine

## 2015-03-28 NOTE — Telephone Encounter (Signed)
Mr. James Barrett called saying he'd like the strongest form of anti-fungal cream to be prescribed for his rash asap so he can take it for 14-21 days. He'd also like Floconazal (sp?) prescribed to him to take for 7 days. He's ok too with receiving a shot if it's stronger and will work faster than a cream. He said the rash has not improved. He took Dr. Claris CheFry's advice as far as not using lotions, detergents, etc on the area. Lotrimin, Tetaconazal, etc won't work for the rash per the pt. He'd like a phone call regarding this to see if he can receive his request.   Pt's ph# [phone] Thank you.

## 2015-03-28 NOTE — Telephone Encounter (Signed)
Call in Ketoconazole 2% cream to apply bid, 30 grams with 5 rf. Call in Fluconazole 100 mg to take bid, #30 with no rf

## 2015-03-29 MED ORDER — FLUCONAZOLE 100 MG PO TABS: 100.0000 mg | ORAL_TABLET | Freq: Two times a day (BID) | ORAL | Status: DC

## 2015-03-29 MED ORDER — KETOCONAZOLE 2 % EX CREA: 1.0000 "application " | TOPICAL_CREAM | Freq: Two times a day (BID) | CUTANEOUS | Status: DC

## 2015-03-29 NOTE — Telephone Encounter (Signed)
Rx was sent  

## 2015-03-31 ENCOUNTER — Telehealth: Payer: Self-pay | Admitting: Family Medicine

## 2015-03-31 NOTE — Telephone Encounter (Signed)
No, these are the cheapest meds of this type that I know of

## 2015-03-31 NOTE — Telephone Encounter (Signed)
Pt said the following med is 117.00  ketoconazole (NIZORAL) 2 % cream , fluconazole (DIFLUCAN) 100 MG tablet   Pt is asking if there is something else that can be rx

## 2015-04-03 NOTE — Telephone Encounter (Signed)
I spoke with pt and went over below information. 

## 2015-05-02 ENCOUNTER — Telehealth: Payer: Self-pay | Admitting: Family Medicine

## 2015-05-02 ENCOUNTER — Encounter: Payer: Self-pay | Admitting: *Deleted

## 2015-05-02 NOTE — Telephone Encounter (Signed)
E 

## 2015-05-02 NOTE — Telephone Encounter (Signed)
He needs to get such a note from his eye doctor

## 2015-05-02 NOTE — Telephone Encounter (Signed)
Pt has a medical issue with his eyes(floaters) and needs a note for 20 percent window tint. Pt just needs the note to keep in his car.

## 2015-05-02 NOTE — Telephone Encounter (Signed)
Left message on machine for patient to return our call - [facility]

## 2015-05-04 NOTE — Telephone Encounter (Signed)
I left a voice message for pt with below information.  

## 2015-05-10 ENCOUNTER — Other Ambulatory Visit: Payer: Self-pay | Admitting: Family Medicine

## 2015-05-10 MED ORDER — AZITHROMYCIN 250 MG PO TABS: ORAL_TABLET | ORAL | Status: DC

## 2015-05-10 MED ORDER — HYDROCODONE-ACETAMINOPHEN 10-325 MG PO TABS: 1.0000 | ORAL_TABLET | Freq: Four times a day (QID) | ORAL | Status: DC | PRN

## 2015-05-10 NOTE — Telephone Encounter (Signed)
rx is ready, also call in a Zpack

## 2015-05-10 NOTE — Telephone Encounter (Addendum)
Pt needs new rx hydrocodone. Pt has a cough and would like md to call in [facility]x [facility]

## 2015-05-10 NOTE — Telephone Encounter (Signed)
Rx was sent and pt is aware that his Rx is ready for pick up

## 2015-05-23 ENCOUNTER — Other Ambulatory Visit: Payer: Self-pay | Admitting: Family Medicine

## 2015-05-23 NOTE — Telephone Encounter (Signed)
Pt needs new rx zpak. Pt told pharm he wash medication in his jeans

## 2015-05-23 NOTE — Telephone Encounter (Signed)
Call in another Zpack  

## 2015-05-24 MED ORDER — AZITHROMYCIN 250 MG PO TABS: ORAL_TABLET | ORAL | Status: DC

## 2015-05-24 NOTE — Telephone Encounter (Signed)
I sent script e-scribe and left a voice message for pt. 

## 2015-05-29 ENCOUNTER — Telehealth: Payer: Self-pay | Admitting: Family Medicine

## 2015-05-29 NOTE — Telephone Encounter (Signed)
Pt needs refill on antivan call into [facility]. Pt brother had an accident and lost his thumb and partially index finger

## 2015-05-29 NOTE — Telephone Encounter (Signed)
Call in Lorazepam 1 mg to use every 6 hours prn anxiety, #60 with no rf

## 2015-05-30 MED ORDER — LORAZEPAM 1 MG PO TABS: 1.0000 mg | ORAL_TABLET | Freq: Four times a day (QID) | ORAL | Status: DC | PRN

## 2015-05-30 NOTE — Telephone Encounter (Signed)
I called in script 

## 2015-06-08 ENCOUNTER — Telehealth: Payer: Self-pay | Admitting: Family Medicine

## 2015-06-08 NOTE — Telephone Encounter (Signed)
[facility] and pt wants to know if this med is OK to take with the  HYDROcodone-acetaminophen (NORCO) 10-325 MG tablet  Pt states he took the LORazepam (ATIVAN) 1 MG tablet for ONE (1) night and woke up in a rage. Pt has not taken anymore of this med.  Pt thinks he had a reaction and made him worse. Pt would like to go into a different direction, but no benzos.   If you need pt to come in, pt will come in.  Pls advise.  [facility]

## 2015-06-08 NOTE — Telephone Encounter (Signed)
Please advise 

## 2015-06-09 NOTE — Telephone Encounter (Signed)
I left a voice message with below information. 

## 2015-06-09 NOTE — Telephone Encounter (Signed)
Tell him the Robaxin would be okay to take. Let's see how this does before we try any new anxiety meds

## 2015-07-06 ENCOUNTER — Telehealth: Payer: Self-pay | Admitting: Family Medicine

## 2015-07-06 NOTE — Telephone Encounter (Signed)
Pt request refill  fluconazole (DIFLUCAN) 100 MG tablet  Pt has a reoccuring rash that is now itching.  [facility]

## 2015-07-07 MED ORDER — FLUCONAZOLE 100 MG PO TABS: 100.0000 mg | ORAL_TABLET | Freq: Two times a day (BID) | ORAL | Status: DC

## 2015-07-07 NOTE — Telephone Encounter (Signed)
To take this bid. Call in #30 with 2 rf

## 2015-07-07 NOTE — Telephone Encounter (Signed)
I sent script e-scribe and left a voice message for pt with this information.  

## 2015-07-10 ENCOUNTER — Encounter: Payer: Self-pay | Admitting: Family Medicine

## 2015-07-11 ENCOUNTER — Telehealth: Payer: Self-pay | Admitting: Family Medicine

## 2015-07-11 MED ORDER — LAMOTRIGINE 100 MG PO TABS: 100.0000 mg | ORAL_TABLET | Freq: Two times a day (BID) | ORAL | Status: DC

## 2015-07-11 NOTE — Telephone Encounter (Signed)
That sounds okay to me. Increase the Lamictal to 100 mg bid. Call in #60 with 5 rf

## 2015-07-12 MED ORDER — HYDROCODONE-ACETAMINOPHEN 10-325 MG PO TABS: 1.0000 | ORAL_TABLET | Freq: Four times a day (QID) | ORAL | Status: DC | PRN

## 2015-07-12 NOTE — Telephone Encounter (Signed)
Script is ready and I sent pt a my chart message.  

## 2015-07-12 NOTE — Telephone Encounter (Signed)
done

## 2015-07-12 NOTE — Addendum Note (Signed)
Addended by: Gershon CraneFRY, STEPHEN A on: 07/12/2015 09:56 AM   Modules accepted: Orders

## 2015-08-22 ENCOUNTER — Telehealth: Payer: Self-pay | Admitting: Family Medicine

## 2015-08-22 NOTE — Telephone Encounter (Signed)
Pt was calling to refill his hydrocodone. Pt  did not know it was already ready for pick up since 3/15.

## 2015-11-17 ENCOUNTER — Telehealth: Payer: Self-pay | Admitting: Family Medicine

## 2015-11-17 ENCOUNTER — Other Ambulatory Visit: Payer: Self-pay | Admitting: Family Medicine

## 2015-11-17 MED ORDER — ALPRAZOLAM 2 MG PO TABS: 2.0000 mg | ORAL_TABLET | Freq: Three times a day (TID) | ORAL | Status: DC | PRN

## 2015-11-17 NOTE — Telephone Encounter (Signed)
Pt following up on med [facility]

## 2015-11-17 NOTE — Telephone Encounter (Signed)
Switch to Xanax 2 mg to take every 8 hours prn anxiety, call in #60 with no rf

## 2015-11-17 NOTE — Telephone Encounter (Signed)
Pt would like to see if his lorazepam dosage can be changed or the medication changed all together.

## 2015-11-17 NOTE — Telephone Encounter (Signed)
I called in new script, removed Lorazepam from current medication list & did cancel any remaining refills with the pharmacy, also spoke with pt and went over this information.

## 2015-12-13 ENCOUNTER — Telehealth: Payer: Self-pay | Admitting: Family Medicine

## 2015-12-13 NOTE — Telephone Encounter (Signed)
Pt would like a refill of Armodafinil (NUVIGIL) 150 MG tablet  Pt was prescribed in the past and it worked. Pt could not afford the $500 cost and now it is $15.00.  Pt states the cost has come down and he can afford now.  Would like to know if will refill for him.  [facility]

## 2015-12-15 MED ORDER — ARMODAFINIL 150 MG PO TABS: 150.0000 mg | ORAL_TABLET | Freq: Every day | ORAL | 0 refills | Status: DC

## 2015-12-15 NOTE — Telephone Encounter (Signed)
Patient notified and verbalized understanding. 

## 2015-12-15 NOTE — Telephone Encounter (Signed)
Done. He should follow up with us in one month

## 2016-02-07 ENCOUNTER — Telehealth: Payer: Self-pay | Admitting: Family Medicine

## 2016-02-07 NOTE — Telephone Encounter (Signed)
Pt request refill  HYDROcodone-acetaminophen (NORCO) 10-325 MG tablet Armodafinil (NUVIGIL) 150 MG tablet  Pt would like a refill of his Xanax, but would like to switch/reduce from alprazolam Prudy Feeler(XANAX) 2 MG tablet To Xanax .5 mg  Pt will pick up all scripts if ok.  Pt would like Dr Clent RidgesFry to know he is having eye surgery for the eye floaters on 10/16.

## 2016-02-08 NOTE — Telephone Encounter (Signed)
[facility] [place] [facility] and see him due to the white spots on the back of his throat.  Pt would like to see if Dr. Clent RidgesFry can cut his Xanax to .05 mg and would like to see if he can pick up his Hydrocodone and Armodafinil on Friday.  I made pt aware of the 3 day protocol for refills and he verbalized he understood.

## 2016-02-08 NOTE — Telephone Encounter (Signed)
Pt has symptoms of strep pt was exposure by his girlfriend who works at school.pt also was exposure to hand mouth and foot disease. Pt has nausea,cough and headache. [facility]

## 2016-02-08 NOTE — Telephone Encounter (Signed)
He will still need an OV for the medications since we have not seen him in over a year

## 2016-02-08 NOTE — Telephone Encounter (Signed)
Per Dr. Clent RidgesFry pt will need a office visit to discuss these issues. Can you call pt to schedule office visit, he will most likely need to see another provider? We are full today and not here on Friday.

## 2016-02-27 ENCOUNTER — Encounter: Payer: Self-pay | Admitting: Family Medicine

## 2016-02-27 ENCOUNTER — Ambulatory Visit (INDEPENDENT_AMBULATORY_CARE_PROVIDER_SITE_OTHER): Payer: Non-veteran care | Admitting: Family Medicine

## 2016-02-27 VITALS — BP 97/71 | HR 72 | Temp 98.2°F | Ht 70.0 in | Wt 146.0 lb

## 2016-02-27 DIAGNOSIS — G8929 Other chronic pain: Secondary | ICD-10-CM

## 2016-02-27 DIAGNOSIS — F4310 Post-traumatic stress disorder, unspecified: Secondary | ICD-10-CM

## 2016-02-27 DIAGNOSIS — F1010 Alcohol abuse, uncomplicated: Secondary | ICD-10-CM

## 2016-02-27 DIAGNOSIS — F411 Generalized anxiety disorder: Secondary | ICD-10-CM

## 2016-02-27 DIAGNOSIS — M25561 Pain in right knee: Secondary | ICD-10-CM

## 2016-02-27 DIAGNOSIS — H43393 Other vitreous opacities, bilateral: Secondary | ICD-10-CM

## 2016-02-27 MED ORDER — ARMODAFINIL 50 MG PO TABS: 50.0000 mg | ORAL_TABLET | Freq: Every day | ORAL | 5 refills | Status: DC

## 2016-02-27 MED ORDER — PREDNISONE 10 MG PO TABS: ORAL_TABLET | ORAL | 0 refills | Status: DC

## 2016-02-27 MED ORDER — ALPRAZOLAM 0.25 MG PO TABS: 0.2500 mg | ORAL_TABLET | Freq: Three times a day (TID) | ORAL | 5 refills | Status: DC | PRN

## 2016-02-27 NOTE — Progress Notes (Signed)
Pre visit review using our clinic review tool, if applicable. No additional management support is needed unless otherwise documented below in the visit note. 

## 2016-02-27 NOTE — Progress Notes (Addendum)
   Subjective:    Patient ID: James SporeMatthew K Roa, male    DOB: 02/14/1986, 30 y.o.   MRN: [id number]  HPI Here to follow up on issues. He has been doing fairly well as far as his anxiety and depression go. He remains clean and sober. He is pleased with how Nuvigil helps his focus especially when the PTSD is acting up. He does not take this every day, but only as needed. He has also been cutting these in half most of the time. He has been cutting his Xanax in half or even in quarters and this has worked well. He is sleeping better. He still has chronic right knee pain but he has greatly reduced his use of narcotics for this. [facility], and this is in process. He has been seeing Dr. Tyrone SchimkeKarl Stonecipher for his vitreous floaters, and he has performed several Jag laser treatments to the eyes which have been very helpful.    Review of Systems  Constitutional: Negative.   Eyes: Positive for visual disturbance.  Respiratory: Negative.   Cardiovascular: Negative.   Neurological: Negative.   Psychiatric/Behavioral: Positive for decreased concentration and dysphoric mood. Negative for agitation, confusion, hallucinations, self-injury, sleep disturbance and suicidal ideas. The patient is nervous/anxious.        Objective:   Physical Exam  Constitutional: He is oriented to person, place, and time. He appears well-developed and well-nourished.  Cardiovascular: Normal rate, regular rhythm, normal heart sounds and intact distal pulses.   Pulmonary/Chest: Effort normal and breath sounds normal.  Neurological: He is alert and oriented to person, place, and time.  Psychiatric: He has a normal mood and affect. His behavior is normal. Judgment and thought content normal.          Assessment & Plan:  We agreed to stop the Norco altogether and he will avoid taking anything for the knee pain. Hopefully the brace will be ready soon. We will decrease  the Xanax to 0.25 mg per dose, and we will decrease the Nuvigil to 50 mg per dose. I suggetsed he get back in psychotherapy and he agreed. He will follow up with Dr. Delaney MeigsStonecipher as well.  Nelwyn SalisburyFRY,STEPHEN A, MD

## 2016-02-28 ENCOUNTER — Encounter: Payer: Self-pay | Admitting: Family Medicine

## 2016-02-28 DIAGNOSIS — H43393 Other vitreous opacities, bilateral: Secondary | ICD-10-CM | POA: Insufficient documentation

## 2016-04-03 ENCOUNTER — Telehealth: Payer: Self-pay | Admitting: Family Medicine

## 2016-04-03 NOTE — Telephone Encounter (Signed)
Pt would like to restart lamotrigine and pt state that he is not able to take alprazolam due to the side effects.  Pt [place] she is having a PET and CT before starting cranial Radiology due to her having cancer.   [facility]

## 2016-04-08 MED ORDER — LAMOTRIGINE 100 MG PO TABS: 100.0000 mg | ORAL_TABLET | Freq: Every day | ORAL | 2 refills | Status: DC

## 2016-04-08 NOTE — Telephone Encounter (Signed)
Call in Lamotrigine 100 mg to take once daily, #30 with 2 rf

## 2016-05-06 ENCOUNTER — Telehealth: Payer: Self-pay | Admitting: Family Medicine

## 2016-05-06 MED ORDER — ALPRAZOLAM 0.25 MG PO TABS: 0.2500 mg | ORAL_TABLET | Freq: Three times a day (TID) | ORAL | 5 refills | Status: DC | PRN

## 2016-05-06 MED ORDER — LAMOTRIGINE 100 MG PO TABS: 100.0000 mg | ORAL_TABLET | Freq: Every day | ORAL | 5 refills | Status: DC

## 2016-05-06 NOTE — Telephone Encounter (Signed)
Pt pharm [facility] has closed. Pt needs new rxs alprazolam 0.25 mg and lamictal  [facility] [address]

## 2016-05-06 NOTE — Telephone Encounter (Signed)
Pt states it has been 2 days with the Xanax.  He did not know the [facility] had closed down until he went to pick up his rx and nothing inside.  Pt had refills but unable to get!! Could you call that in today?

## 2016-05-06 NOTE — Telephone Encounter (Signed)
[facility] and sent pt a my chart message.

## 2016-05-06 NOTE — Telephone Encounter (Signed)
Call in Alprazolam #60 with 5 rf, also Lamotrigine #30 with 5 rf

## 2016-08-27 ENCOUNTER — Telehealth: Payer: Self-pay | Admitting: Family Medicine

## 2016-08-27 MED ORDER — DOXYCYCLINE HYCLATE 100 MG PO TABS: 100.0000 mg | ORAL_TABLET | Freq: Two times a day (BID) | ORAL | 0 refills | Status: DC

## 2016-08-27 NOTE — Telephone Encounter (Signed)
Please call this in, to take bid

## 2016-08-27 NOTE — Telephone Encounter (Signed)
Pt needs doxycyline 100 mg #30 for acne on his shoulder. [facility] [address]. Pt mom is under dr Truett Perna oncologist care

## 2016-08-27 NOTE — Telephone Encounter (Signed)
I [facility] to [facility] and spoke with pt  

## 2017-01-16 ENCOUNTER — Encounter: Payer: Self-pay | Admitting: Family Medicine

## 2017-02-20 ENCOUNTER — Telehealth: Payer: Self-pay

## 2017-02-20 NOTE — Telephone Encounter (Signed)
Actually I think the least sedating muscle relaxer is Metaxalone. If he wishes, call in 800 mg to take QID as needed, #120 with 5 rf

## 2017-02-20 NOTE — Telephone Encounter (Signed)
Patient called to ask about the maximum dosage for ibuprofen and acetaminophen. I advised pt to follow the directions on the bottle and to not exceed the maximum recommended dosage on the label. He states that he has been taking "way more" than the bottle states. He has been trying to help with his back pain and the pain s/p B knee surgery. He is not wanting to go back to taking opioids. I advised him to alternate ibuprofen and acetaminophen to try and help with his pain. He has also been taking methocarbamol for his muscle pain. He states that this makes him very sleepy. He would like to know what else he could take.   Dr. Clent RidgesFry - Please advise if in agreement with above recommendations as well as if orphenadrine may be a better option for a less drowsy muscle med? Thanks!!

## 2017-02-20 NOTE — Telephone Encounter (Signed)
ATC pt but vm has not been set up. WCB

## 2017-02-21 MED ORDER — METAXALONE 800 MG PO TABS: 800.0000 mg | ORAL_TABLET | Freq: Four times a day (QID) | ORAL | 5 refills | Status: DC

## 2017-02-21 NOTE — Telephone Encounter (Signed)
Spoke with pt and advised. He would like to try. Rx sent to pharmacy requested. Advised pt to call back if this is not helping. Nothing further needed at this time.

## 2017-03-25 ENCOUNTER — Telehealth: Payer: Self-pay | Admitting: Family Medicine

## 2017-03-25 NOTE — Telephone Encounter (Signed)
Copied from CRM [id number]. Topic: Quick Communication - See Telephone Encounter >> Mar 25, 2017 10:38 AM Waymon AmatoBurton, Donna F wrote: CRM for notification. See Telephone encounter for: pt is looking for a refill on his prednizone and is needing to change it to be [facility] [address] best number [phone]  Best number [phone] And to have the muscle relaxer that was filled on 02/21/17 [facility] as well for next refill  03/25/17.

## 2017-03-26 NOTE — Telephone Encounter (Signed)
Requesting refill on meds.

## 2017-03-26 NOTE — Telephone Encounter (Signed)
Sent to PCP for approval.  

## 2017-03-27 MED ORDER — METAXALONE 800 MG PO TABS: 800.0000 mg | ORAL_TABLET | Freq: Four times a day (QID) | ORAL | 0 refills | Status: DC

## 2017-03-27 MED ORDER — PREDNISONE 10 MG PO TABS: ORAL_TABLET | ORAL | 0 refills | Status: DC

## 2017-03-27 NOTE — Telephone Encounter (Signed)
Rx have been sent in. Called pt and on both mobil and home phone to advise OV is needed soon but unable to do so pt's VM has NOT been set up yet.

## 2017-03-27 NOTE — Telephone Encounter (Signed)
[facility] Prednisone #60 with no refills. He will need an OV soon

## 2018-03-18 ENCOUNTER — Telehealth: Payer: Self-pay | Admitting: Family Medicine

## 2018-03-18 NOTE — Telephone Encounter (Signed)
Dr. Clent RidgesFry please advise. Pt was advised he would need an appt with you but insisted you be asked first.  Thanks

## 2018-03-18 NOTE — Telephone Encounter (Signed)
Copied from CRM [id number]. Topic: Quick Communication - See Telephone Encounter >> Mar 18, 2018 10:07 AM Windy KalataMichael, Zion Ta L, NT wrote: CRM for notification. See Telephone encounter for: 03/18/18.  Patient is calling and states he was [facility] but [facility] are [facility]6 weeks out and he states he thinks he has a upper respiratory infection like his wife does. Patient has been coughing and has been taking Doxycyline but states it is 33 years old. Patient called in requesting if Dr. Clent RidgesFry would refill the following 2 medications. I informed the patient he has not been seen since 2017 and would need an appointment. Patient states he can not miss work and wanted me to check with Dr. Clent RidgesFry.  azithromycin (ZITHROMAX Z-PAK) 250 MG tablet predniSONE (DELTASONE) 10 MG tablet  [facility] [address] - [place], [place] - [id number] [place] [address] Phone: [phone] Fax: [phone]

## 2018-03-19 NOTE — Telephone Encounter (Signed)
Called and spoke with pt and he was upset that Dr. Clent RidgesFry could not call any meds in for him since he has not been seen since 2017.  Pt stated that he cannot take any time off of work.

## 2018-03-19 NOTE — Telephone Encounter (Signed)
He will need an OV for this  

## 2018-07-21 ENCOUNTER — Encounter: Payer: Self-pay | Admitting: Family Medicine

## 2018-07-21 ENCOUNTER — Ambulatory Visit (INDEPENDENT_AMBULATORY_CARE_PROVIDER_SITE_OTHER): Payer: Non-veteran care | Admitting: Family Medicine

## 2018-07-21 ENCOUNTER — Ambulatory Visit: Payer: Self-pay | Admitting: Family Medicine

## 2018-07-21 ENCOUNTER — Other Ambulatory Visit: Payer: Self-pay

## 2018-07-21 DIAGNOSIS — B9789 Other viral agents as the cause of diseases classified elsewhere: Secondary | ICD-10-CM | POA: Diagnosis not present

## 2018-07-21 DIAGNOSIS — J069 Acute upper respiratory infection, unspecified: Secondary | ICD-10-CM | POA: Diagnosis not present

## 2018-07-21 NOTE — Telephone Encounter (Signed)
Triage RN recommended pt to call back at 1100; pt called back @1147  to check status.

## 2018-07-21 NOTE — Progress Notes (Signed)
   Subjective:    Patient ID: James Barrett, male    DOB: 09-Apr-1986, 33 y.o.   MRN: [id number]  HPI Virtual Visit via Telephone Note  I connected with him on 07/21/18 at  2:00 PM EDT by telephone and verified that I am speaking with the correct person using two identifiers.   I discussed the limitations, risks, security and privacy concerns of performing an evaluation and management service by telephone and the availability of in person appointments. I also discussed with the patient that there may be a patient responsible charge related to this service. The patient expressed understanding and agreed to proceed.  Location patient: home Location provider: work or home office Participants present for the call: patient, provider Patient did not have a visit in the prior 7 days to address this/these issue(s).   History of Present Illness: He sya he started to feel bad 17 days ago and still does. For the first few days he had low grade fevers and body aches, but these resolved. He also developed a dry cough, and this cough has persisted ever since. No ST or headache or NVD. He has been taking Benzonatate he had at home with no relief. Drinking fluids. He went to the [place] [facility] a week ago and had a normal CXR. He was given a Zpack, but this did not help. He has been quarantining himself at home for 10 days. He asks if I would prescribe him some hydroxychloroquine since hehas been reading about this in the internet.    Observations/Objective: Patient sounds cheerful and well on the phone. I do not appreciate any SOB. Speech and thought processing are grossly intact. He does cough frequently during our interview.   Assessment and Plan: He may very well have the Covid-19 virus but I explained that there is no way to test for this. Since it is a virus, the infection will run its course and he should recover soon. I suggested he try Delsym for the cough. I told him that the current protocol is to  avoid prescribing hydroxychloroquine for this virus, and he voiced understanding.   Follow Up Instructions: As needed     [id number] 5-10 [id number] 11-20 9443 21-30 I did not refer this patient for an OV in the next 24 hours for this/these issue(s).  I discussed the assessment and treatment plan with the patient. The patient was provided an opportunity to ask questions and all were answered. The patient agreed with the plan and demonstrated an understanding of the instructions.   The patient was advised to call back or seek an in-person evaluation if the symptoms worsen or if the condition fails to improve as anticipated.  I provided  18 minutes of non-face-to-face time during this encounter.   Gershon Crane, MD    Review of Systems     Objective:   Physical Exam        Assessment & Plan:

## 2018-07-21 NOTE — Telephone Encounter (Signed)
Pt. Reports he started having a non-productive cough,chills 17 days ago. Has been to the [place] twice.They did a chest x-ray that was negative.He is taking Claritin and is on a Z-Pak now. Still has a dry cough and shortness of breath. States "I know there is something more going on." Would like to have a virtual visit with Dr. Clent Ridges. Has self-quarantined x 10 days. Has stopped vaping. No fever. Please advise pt. Answer Assessment - Initial Assessment Questions 1. ONSET: "When did the cough begin?"      17 days of dry cough 2. SEVERITY: "How bad is the cough today?"      Moderate 3. RESPIRATORY DISTRESS: "Describe your breathing."      Shortness of breat with exertion 4. FEVER: "Do you have a fever?" If so, ask: "What is your temperature, how was it measured, and when did it start?"     No fever today 5. HEMOPTYSIS: "Are you coughing up any blood?" If so ask: "How much?" (flecks, streaks, tablespoons, etc.)     No 6. TREATMENT: "What have you done so far to treat the cough?" (e.g., meds, fluids, humidifier)     Claritin and Z-pak 7. CARDIAC HISTORY: "Do you have any history of heart disease?" (e.g., heart attack, congestive heart failure)      No 8. LUNG HISTORY: "Do you have any history of lung disease?"  (e.g., pulmonary embolus, asthma, emphysema)     No 9. PE RISK FACTORS: "Do you have a history of blood clots?" (or: recent major surgery, recent prolonged travel, bedridden)     No 10. OTHER SYMPTOMS: "Do you have any other symptoms? (e.g., runny nose, wheezing, chest pain)       Feels a flutter in his windpipe 11. PREGNANCY: "Is there any chance you are pregnant?" "When was your last menstrual period?"       n/a 12. TRAVEL: "Have you traveled out of the country in the last month?" (e.g., travel history, exposures)       No travel  Protocols used: COUGH - ACUTE NON-PRODUCTIVE-A-AH

## 2018-07-21 NOTE — Telephone Encounter (Signed)
I called and spoke with pt and schedule his appt with Dr. Clent Ridges for 2 pm webex.

## 2019-01-06 ENCOUNTER — Telehealth: Payer: Self-pay

## 2019-01-06 NOTE — Telephone Encounter (Signed)
Please advise. Ok for letter? 

## 2019-01-06 NOTE — Telephone Encounter (Signed)
Copied from [place] [id number]. Topic: General - Other >> Jan 06, 2019 10:52 AM Rainey Pines A wrote: Patient started using CBD(capsules and lotions) about 6 months ago and feels that its helped him more than any prescription that hes been on. 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 Patient stated that he doesn't want to take pain medication/muscle relaxers because he is a recovering alcoholic. Patient would like a callback from Dr. Sarajane Jews nurse in regards to if Dr. Sarajane Jews would be able provide this type of documentation given the patient schedule an appointment.

## 2019-01-07 NOTE — Telephone Encounter (Addendum)
Pt has been schedule for doxy visit on 01-12-2019

## 2019-01-07 NOTE — Telephone Encounter (Signed)
Yes I can do a letter but set up a Doxy visit for [place] to discuss this

## 2019-01-07 NOTE — Telephone Encounter (Signed)
Noted. Nothing further needed. 

## 2019-01-12 ENCOUNTER — Encounter: Payer: Self-pay | Admitting: Family Medicine

## 2019-01-12 ENCOUNTER — Ambulatory Visit (INDEPENDENT_AMBULATORY_CARE_PROVIDER_SITE_OTHER): Payer: Non-veteran care | Admitting: Family Medicine

## 2019-01-12 ENCOUNTER — Other Ambulatory Visit: Payer: Self-pay

## 2019-01-12 DIAGNOSIS — F411 Generalized anxiety disorder: Secondary | ICD-10-CM

## 2019-01-12 DIAGNOSIS — G8929 Other chronic pain: Secondary | ICD-10-CM | POA: Diagnosis not present

## 2019-01-12 DIAGNOSIS — M5440 Lumbago with sciatica, unspecified side: Secondary | ICD-10-CM | POA: Diagnosis not present

## 2019-01-12 DIAGNOSIS — F4310 Post-traumatic stress disorder, unspecified: Secondary | ICD-10-CM

## 2019-01-13 ENCOUNTER — Encounter: Payer: Self-pay | Admitting: Family Medicine

## 2019-01-13 NOTE — Progress Notes (Signed)
   Subjective:    Patient ID: James Barrett, male    DOB: 01/04/1986, 33 y.o.   MRN: [id number]  HPI Virtual Visit via Telephone Note  I connected with the patient on 01/13/19 at  1:30 PM EDT by telephone and verified that I am speaking with the correct person using two identifiers. We attempted to connect virtually but we had technical difficulties with the audio and video.     I discussed the limitations, risks, security and privacy concerns of performing an evaluation and management service by telephone and the availability of in person appointments. I also discussed with the patient that there may be a patient responsible charge related to this service. The patient expressed understanding and agreed to proceed.  Location patient: home Location provider: work or home office Participants present for the call: patient, provider Patient did not have a visit in the prior 7 days to address this/these issue(s).   History of Present Illness: Here to discuss his use of CBD products and to ask me for a letter about this. He has a long hx of chronic pain and of anxiety, and he has a hx of alcohol and substance abuse. He is proud to say that he has not taken any narcotic medications or consumed any alcohol for over 3 years. He has been using CBD products to treat these conditions, and they have been very effective for him. He takes two 10 mg capsules of CBD oil every day with good results. He has been volunteering with the [employer]. And he is taking classes to become an Surveyor, minerals. These employees are occasionally asked to submit urine samples for drug testing, and he has discussed his use of CBD products with his supervisor. Of course these products are legal since they contain less than 0.3 % of THC. His supervisor is okay with this, but he advised James Barrett to get a letter from his doctor to document this use.    Observations/Objective: Patient sounds cheerful and well on the  phone. I do not appreciate any SOB. Speech and thought processing are grossly intact. Patient reported vitals:  Assessment and Plan: He uses OTC CBD products to help with sleep and with relief of pain and anxiety. I agree with this use, and I will draft a letter stating my acknowledgement.  Alysia Penna, MD   Follow Up Instructions:     [phone] 11-20 [phone] 21-30 I did not refer this patient for an OV in the next 24 hours for this/these issue(s).  I discussed the assessment and treatment plan with the patient. The patient was provided an opportunity to ask questions and all were answered. The patient agreed with the plan and demonstrated an understanding of the instructions.   The patient was advised to call back or seek an in-person evaluation if the symptoms worsen or if the condition fails to improve as anticipated.  I provided 10 minutes of non-face-to-face time during this encounter.   Alysia Penna, MD    Review of Systems     Objective:   Physical Exam        Assessment & Plan:

## 2019-02-12 ENCOUNTER — Telehealth: Payer: Self-pay

## 2019-02-12 NOTE — Telephone Encounter (Signed)
Please advise 

## 2019-02-12 NOTE — Telephone Encounter (Signed)
Copied from [place] [id number]. Topic: General - Inquiry >> Feb 12, 2019 12:16 PM Richardo Priest, NT wrote: Reason for CRM: Patient called and is requesting that PCP call and write script for prednisone for his back injury that occurred again. Please advise. Pharmacy is able to do is [facility] [address], [place], [place] [address]

## 2019-02-15 NOTE — Telephone Encounter (Signed)
Since Dr. Sarajane Jews is not in, I think it would be reasonable to set up virtual visit with Dr. Maudie Mercury if possible to discuss details and evaluate.

## 2019-02-15 NOTE — Telephone Encounter (Signed)
Left message to return phone call.

## 2019-02-16 NOTE — Telephone Encounter (Signed)
Left 2 messages for pt 

## 2019-03-05 ENCOUNTER — Telehealth: Payer: Self-pay | Admitting: Family Medicine

## 2019-03-05 ENCOUNTER — Encounter: Payer: Self-pay | Admitting: Family Medicine

## 2019-03-05 ENCOUNTER — Telehealth (INDEPENDENT_AMBULATORY_CARE_PROVIDER_SITE_OTHER): Payer: Non-veteran care | Admitting: Family Medicine

## 2019-03-05 ENCOUNTER — Other Ambulatory Visit: Payer: Self-pay

## 2019-03-05 DIAGNOSIS — M549 Dorsalgia, unspecified: Secondary | ICD-10-CM | POA: Diagnosis not present

## 2019-03-05 DIAGNOSIS — H9201 Otalgia, right ear: Secondary | ICD-10-CM

## 2019-03-05 MED ORDER — AZITHROMYCIN 250 MG PO TABS: ORAL_TABLET | ORAL | 0 refills | Status: DC

## 2019-03-05 MED ORDER — METAXALONE 800 MG PO TABS: 800.0000 mg | ORAL_TABLET | Freq: Four times a day (QID) | ORAL | 5 refills | Status: DC | PRN

## 2019-03-05 NOTE — Progress Notes (Signed)
Virtual Visit via Telephone Note  I connected with the patient on 03/05/19 at  9:00 AM EST by telephone and verified that I am speaking with the correct person using two identifiers. We attempted to connect virtually but we had technical difficulties with the audio and video.     I discussed the limitations, risks, security and privacy concerns of performing an evaluation and management service by telephone and the availability of in person appointments. I also discussed with the patient that there may be a patient responsible charge related to this service. The patient expressed understanding and agreed to proceed.  Location patient: home Location provider: work or home office Participants present for the call: patient, provider Patient did not have a visit in the prior 7 days to address this/these issue(s).   History of Present Illness: Here for 2 issues. First he has had pain in the right ear for 4 days, along with some sinus congestion. No fever or ST or cough. Second he has frequent back spasms, and his underlying scoliosis is likely a contributing factor. Flexeril is too sedating, and he asks of there is something else he can try that is not a "pain medication".    Observations/Objective: Patient sounds cheerful and well on the phone. I do not appreciate any SOB. Speech and thought processing are grossly intact. Patient reported vitals:  Assessment and Plan: Treat the earache with a Zpack. For the back spasms, try Skelaxin. Alysia Penna, MD   Follow Up Instructions:     [phone] 5-10 [id number] 11-20 [id number] 21-30 I did not refer this patient for an OV in the next 24 hours for this/these issue(s).  I discussed the assessment and treatment plan with the patient. The patient was provided an opportunity to ask questions and all were answered. The patient agreed with the plan and demonstrated an understanding of the instructions.   The patient was advised to call back or seek an  in-person evaluation if the symptoms worsen or if the condition fails to improve as anticipated.  I provided 20 minutes of non-face-to-face time during this encounter.   Alysia Penna, MD

## 2019-03-05 NOTE — Telephone Encounter (Signed)
Pt states that metaxalone (SKELAXIN) 800 MG tablet is $300, and is out of his budget. Please call pt to advise: [phone]

## 2019-03-08 NOTE — Telephone Encounter (Signed)
Cancel the Skelaxin. Instead call in a Medrol dose pack.

## 2019-03-08 NOTE — Telephone Encounter (Signed)
OD you know of any subs?

## 2019-03-08 NOTE — Telephone Encounter (Signed)
Patient is awaiting the response on skelaxin medication cost.  The medication is over $300.  Which is too much for him.   Patient states he is ok taking prednisone. It can make him irritable. But that is ok. Please advise Cb- [phone]

## 2019-03-10 ENCOUNTER — Other Ambulatory Visit: Payer: Self-pay

## 2019-03-10 MED ORDER — METHYLPREDNISOLONE 4 MG PO TBPK: ORAL_TABLET | ORAL | 0 refills | Status: DC

## 2019-03-10 NOTE — Telephone Encounter (Signed)
Noted! Pt notified of updated and verbalized understanding.

## 2019-06-14 ENCOUNTER — Telehealth: Payer: Self-pay | Admitting: Family Medicine

## 2019-06-14 NOTE — Telephone Encounter (Signed)
Pt called wanted to inform that [facility]ep[facility].   Pt has solved rash on his face he his allergic to allergy to propylene glycol and wanted to make Dr. Clent Ridges.

## 2019-06-14 NOTE — Telephone Encounter (Signed)
Noted  

## 2019-06-23 ENCOUNTER — Telehealth: Payer: Self-pay | Admitting: Family Medicine

## 2019-06-23 NOTE — Telephone Encounter (Signed)
Pt is requesting a prescription for Clonidine for his anxiety. Pt uses [facility] in [place] at [facility]. Pt was in the Hospital this morning for an anxiety attack. Thanks

## 2019-06-23 NOTE — Telephone Encounter (Signed)
FYI  Spoke to pt and advised that an appt. May be needed. Rx is not on pt med list and may be first time on this. Pt declined and stated that he will call back if needed. Pt stated that he is waiting on the [facility] to [facility] to advised on issue. No further at this time.

## 2021-02-23 ENCOUNTER — Encounter: Payer: Self-pay | Admitting: Family Medicine

## 2021-02-23 ENCOUNTER — Telehealth: Payer: Non-veteran care | Admitting: Family Medicine

## 2021-02-23 VITALS — BP 128/82 | HR 102 | Ht 69.0 in | Wt 169.0 lb

## 2021-02-26 ENCOUNTER — Telehealth: Payer: Non-veteran care | Admitting: Family Medicine

## 2021-03-01 ENCOUNTER — Encounter: Payer: Non-veteran care | Admitting: Family Medicine

## 2021-03-05 NOTE — Progress Notes (Deleted)
This visit was cancelled. Please make this go away. Thanks

## 2021-03-05 NOTE — Progress Notes (Signed)
This visit was cancelled.

## 2022-03-08 ENCOUNTER — Telehealth: Payer: Self-pay

## 2022-03-08 NOTE — Telephone Encounter (Signed)
Transition Care Management Unsuccessful Follow-up Telephone Call  Date of discharge and from where:  03/06/22 from [facility] Calculus of ureter  Attempts:  1st Attempt  Reason for unsuccessful TCM follow-up call:  Unable to leave message
